# Patient Record
Sex: Female | Born: 1950 | Race: White | Hispanic: No | Marital: Single | Smoking: Former smoker
Health system: Southern US, Community
[De-identification: ages and names within clinical notes are randomized; demographics above are authoritative.]

## PROBLEM LIST (undated history)

## (undated) DIAGNOSIS — I639 Cerebral infarction, unspecified: Secondary | ICD-10-CM

## (undated) DIAGNOSIS — E785 Hyperlipidemia, unspecified: Secondary | ICD-10-CM

## (undated) DIAGNOSIS — I2510 Atherosclerotic heart disease of native coronary artery without angina pectoris: Secondary | ICD-10-CM

## (undated) DIAGNOSIS — R0902 Hypoxemia: Secondary | ICD-10-CM

## (undated) DIAGNOSIS — J449 Chronic obstructive pulmonary disease, unspecified: Secondary | ICD-10-CM

## (undated) DIAGNOSIS — Q211 Atrial septal defect, unspecified: Secondary | ICD-10-CM

## (undated) DIAGNOSIS — Z720 Tobacco use: Secondary | ICD-10-CM

## (undated) DIAGNOSIS — I517 Cardiomegaly: Secondary | ICD-10-CM

## (undated) HISTORY — PX: OTHER SURGICAL HISTORY: SHX169

## (undated) HISTORY — DX: Cardiomegaly: I51.7

## (undated) HISTORY — DX: Chronic obstructive pulmonary disease, unspecified: J44.9

## (undated) HISTORY — DX: Atrial septal defect: Q21.1

## (undated) HISTORY — DX: Hypoxemia: R09.02

## (undated) HISTORY — DX: Hyperlipidemia, unspecified: E78.5

## (undated) HISTORY — DX: Tobacco use: Z72.0

## (undated) HISTORY — DX: Atrial septal defect, unspecified: Q21.10

## (undated) HISTORY — DX: Cerebral infarction, unspecified: I63.9

## (undated) HISTORY — DX: Atherosclerotic heart disease of native coronary artery without angina pectoris: I25.10

---

## 2000-01-31 ENCOUNTER — Inpatient Hospital Stay (HOSPITAL_COMMUNITY): Admission: EM | Admit: 2000-01-31 | Discharge: 2000-02-08 | Payer: Self-pay

## 2000-02-01 ENCOUNTER — Encounter: Payer: Self-pay | Admitting: *Deleted

## 2000-02-06 ENCOUNTER — Encounter: Payer: Self-pay | Admitting: Neurology

## 2000-02-07 ENCOUNTER — Encounter: Payer: Self-pay | Admitting: Neurology

## 2000-06-04 ENCOUNTER — Ambulatory Visit (HOSPITAL_COMMUNITY): Admission: RE | Admit: 2000-06-04 | Discharge: 2000-06-04 | Payer: Self-pay | Admitting: *Deleted

## 2000-06-04 ENCOUNTER — Encounter: Payer: Self-pay | Admitting: *Deleted

## 2000-06-18 ENCOUNTER — Ambulatory Visit (HOSPITAL_COMMUNITY): Admission: RE | Admit: 2000-06-18 | Discharge: 2000-06-18 | Payer: Self-pay | Admitting: Interventional Radiology

## 2000-06-20 ENCOUNTER — Encounter: Payer: Self-pay | Admitting: Pulmonary Disease

## 2000-06-25 ENCOUNTER — Encounter: Payer: Self-pay | Admitting: *Deleted

## 2000-06-25 ENCOUNTER — Ambulatory Visit (HOSPITAL_COMMUNITY): Admission: RE | Admit: 2000-06-25 | Discharge: 2000-06-25 | Payer: Self-pay | Admitting: *Deleted

## 2000-06-27 ENCOUNTER — Ambulatory Visit (HOSPITAL_COMMUNITY): Admission: RE | Admit: 2000-06-27 | Discharge: 2000-06-28 | Payer: Self-pay | Admitting: Interventional Radiology

## 2000-06-28 ENCOUNTER — Encounter: Payer: Self-pay | Admitting: Pulmonary Disease

## 2000-08-20 ENCOUNTER — Ambulatory Visit (HOSPITAL_COMMUNITY): Admission: RE | Admit: 2000-08-20 | Discharge: 2000-08-20 | Payer: Self-pay | Admitting: *Deleted

## 2000-08-20 ENCOUNTER — Encounter: Payer: Self-pay | Admitting: *Deleted

## 2000-11-05 ENCOUNTER — Other Ambulatory Visit: Admission: RE | Admit: 2000-11-05 | Discharge: 2000-11-05 | Payer: Self-pay | Admitting: *Deleted

## 2001-11-19 ENCOUNTER — Ambulatory Visit (HOSPITAL_COMMUNITY): Admission: RE | Admit: 2001-11-19 | Discharge: 2001-11-19 | Payer: Self-pay | Admitting: Interventional Radiology

## 2002-01-21 ENCOUNTER — Other Ambulatory Visit: Admission: RE | Admit: 2002-01-21 | Discharge: 2002-01-21 | Payer: Self-pay | Admitting: Obstetrics and Gynecology

## 2002-03-02 ENCOUNTER — Encounter: Payer: Self-pay | Admitting: *Deleted

## 2002-03-02 ENCOUNTER — Ambulatory Visit (HOSPITAL_COMMUNITY): Admission: RE | Admit: 2002-03-02 | Discharge: 2002-03-02 | Payer: Self-pay | Admitting: *Deleted

## 2003-11-12 ENCOUNTER — Ambulatory Visit (HOSPITAL_COMMUNITY): Admission: RE | Admit: 2003-11-12 | Discharge: 2003-11-12 | Payer: Self-pay | Admitting: *Deleted

## 2003-11-12 ENCOUNTER — Encounter: Payer: Self-pay | Admitting: Pulmonary Disease

## 2004-01-04 ENCOUNTER — Other Ambulatory Visit: Admission: RE | Admit: 2004-01-04 | Discharge: 2004-01-04 | Payer: Self-pay | Admitting: Obstetrics and Gynecology

## 2004-10-24 ENCOUNTER — Encounter: Admission: RE | Admit: 2004-10-24 | Discharge: 2004-10-24 | Payer: Self-pay | Admitting: Unknown Physician Specialty

## 2005-01-24 ENCOUNTER — Other Ambulatory Visit: Admission: RE | Admit: 2005-01-24 | Discharge: 2005-01-24 | Payer: Self-pay | Admitting: Obstetrics and Gynecology

## 2005-08-18 ENCOUNTER — Encounter: Admission: RE | Admit: 2005-08-18 | Discharge: 2005-08-18 | Payer: Self-pay | Admitting: Unknown Physician Specialty

## 2006-08-07 ENCOUNTER — Encounter: Admission: RE | Admit: 2006-08-07 | Discharge: 2006-08-07 | Payer: Self-pay | Admitting: *Deleted

## 2006-09-30 ENCOUNTER — Encounter: Admission: RE | Admit: 2006-09-30 | Discharge: 2006-09-30 | Payer: Self-pay | Admitting: Family Medicine

## 2007-11-19 ENCOUNTER — Encounter: Payer: Self-pay | Admitting: Pulmonary Disease

## 2007-11-26 ENCOUNTER — Encounter: Payer: Self-pay | Admitting: Pulmonary Disease

## 2007-11-27 ENCOUNTER — Ambulatory Visit: Payer: Self-pay | Admitting: Pulmonary Disease

## 2007-11-27 DIAGNOSIS — J4489 Other specified chronic obstructive pulmonary disease: Secondary | ICD-10-CM | POA: Insufficient documentation

## 2007-11-27 DIAGNOSIS — J449 Chronic obstructive pulmonary disease, unspecified: Secondary | ICD-10-CM | POA: Insufficient documentation

## 2007-11-27 DIAGNOSIS — E785 Hyperlipidemia, unspecified: Secondary | ICD-10-CM | POA: Insufficient documentation

## 2007-11-27 DIAGNOSIS — R69 Illness, unspecified: Secondary | ICD-10-CM | POA: Insufficient documentation

## 2007-11-27 DIAGNOSIS — I6350 Cerebral infarction due to unspecified occlusion or stenosis of unspecified cerebral artery: Secondary | ICD-10-CM | POA: Insufficient documentation

## 2007-11-27 DIAGNOSIS — F17200 Nicotine dependence, unspecified, uncomplicated: Secondary | ICD-10-CM | POA: Insufficient documentation

## 2007-11-28 ENCOUNTER — Telehealth (INDEPENDENT_AMBULATORY_CARE_PROVIDER_SITE_OTHER): Payer: Self-pay | Admitting: *Deleted

## 2007-12-04 ENCOUNTER — Encounter: Payer: Self-pay | Admitting: Pulmonary Disease

## 2007-12-11 ENCOUNTER — Encounter: Payer: Self-pay | Admitting: Pulmonary Disease

## 2007-12-11 HISTORY — PX: CARDIAC CATHETERIZATION: SHX172

## 2007-12-29 ENCOUNTER — Ambulatory Visit: Payer: Self-pay | Admitting: Pulmonary Disease

## 2008-01-08 ENCOUNTER — Ambulatory Visit: Payer: Self-pay | Admitting: Pulmonary Disease

## 2008-02-05 ENCOUNTER — Telehealth: Payer: Self-pay | Admitting: Pulmonary Disease

## 2008-02-24 ENCOUNTER — Ambulatory Visit: Payer: Self-pay | Admitting: Pulmonary Disease

## 2008-05-05 ENCOUNTER — Inpatient Hospital Stay (HOSPITAL_COMMUNITY): Admission: RE | Admit: 2008-05-05 | Discharge: 2008-05-06 | Payer: Self-pay | Admitting: Cardiology

## 2008-05-05 ENCOUNTER — Encounter (INDEPENDENT_AMBULATORY_CARE_PROVIDER_SITE_OTHER): Payer: Self-pay | Admitting: Cardiology

## 2008-05-05 HISTORY — PX: ASD REPAIR: SHX258

## 2008-05-21 ENCOUNTER — Encounter: Payer: Self-pay | Admitting: Internal Medicine

## 2008-11-11 ENCOUNTER — Encounter: Payer: Self-pay | Admitting: Pulmonary Disease

## 2009-05-12 ENCOUNTER — Encounter: Payer: Self-pay | Admitting: Pulmonary Disease

## 2009-05-19 ENCOUNTER — Encounter: Payer: Self-pay | Admitting: Pulmonary Disease

## 2009-06-14 ENCOUNTER — Ambulatory Visit: Payer: Self-pay | Admitting: Psychology

## 2009-07-15 ENCOUNTER — Encounter: Payer: Self-pay | Admitting: Pulmonary Disease

## 2009-08-03 ENCOUNTER — Ambulatory Visit: Payer: Self-pay | Admitting: Psychology

## 2009-08-26 ENCOUNTER — Ambulatory Visit: Payer: Self-pay | Admitting: Pulmonary Disease

## 2009-10-24 ENCOUNTER — Ambulatory Visit: Payer: Self-pay | Admitting: Pulmonary Disease

## 2009-11-14 ENCOUNTER — Encounter: Admission: RE | Admit: 2009-11-14 | Discharge: 2009-11-14 | Payer: Self-pay | Admitting: Family Medicine

## 2009-11-16 ENCOUNTER — Ambulatory Visit: Payer: Self-pay | Admitting: Pulmonary Disease

## 2009-12-14 ENCOUNTER — Encounter: Admission: RE | Admit: 2009-12-14 | Discharge: 2009-12-14 | Payer: Self-pay | Admitting: Family Medicine

## 2010-01-30 ENCOUNTER — Encounter: Admission: RE | Admit: 2010-01-30 | Discharge: 2010-01-30 | Payer: Self-pay | Admitting: Emergency Medicine

## 2010-05-30 NOTE — Assessment & Plan Note (Signed)
Summary: sob,prod cough, no improvement after 2 rounds pred./apc   Visit Type:  Follow-up Copy to:  Dr. Jacinto Halim Primary Provider/Referring Provider:  Dr.Keever at [facility]  CC:  Pt c/o wheezing, SOB with exertion and at rest, and and productive cough with "yellowish" mucus since July 1st.  History of Present Illness: 58/F,  heavy smoker, about 79 Pyrs with sleep related hypoxemia she has undergone extensive neurological evaluation for memory deficits  incl PSG 3/11 - showed desaturations for 29 mins out of 7 hrs of sleep, bad performance review at work. gained 15 lbs, back to smoking  >> joined a gym, wt watchers, back on chantix got off prozac & keppra Feel sleep related hypoxemia is a combination of COPD & PFO , although shunt was not evident on prior studies. Trial of  nocturnal O2  helped  November 16, 2009 3:08 PM  EPisode of 'bronchitis ' 3 wks ago - coughing, wheezing - given biaxin & prednisone dosepak - caused mood changes & irritability, CXR clear. Now dyspnea improved, clear phlegm but feels 'lousy' Labs - WC 12k, high LDL & HDL, low vit D levels. she has stopped spiriva >> resumed  Preventive Screening-Counseling & Management  Alcohol-Tobacco     Smoking Status: current     Smoking Cessation Counseling: yes     Packs/Day: 0.75     Year Started: 30 years ago - (1979)  Current Medications (verified): 1)  Boniva 150 Mg Tabs (Ibandronate Sodium) .... Take 1 Tablet By Mouth Once A Week 2)  Aspirin 81 Mg Tabs (Aspirin) .Marland Kitchen.. 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 Cardiac evaluation in the past for MCA CVA has revealed Atrial septal aneurysm with PFO but no significant Rt to Lt shunt, 1-2+ MR. Spirometry >>FEV1 55%, no BD response Desaturates on exertion - this is somewhat out of proportion to degree of airway obstruction. 8/09>>  cath data RA 14/11, RV 35/10, PA 35/13, PCWP 12, LA 15/18, CI 3.45. No L--> R shunt , able to cross PFO from RA to LA echo >> nml LV fn, no ASD on bubble study 10/27 >> PFTs FEV1 61%, DLCO 71%, decreased lung volumes CXR >> no evidence of ILD  Social History: Packs/Day:  0.75  Review of Systems       The patient complains of dyspnea on exertion.  The patient denies anorexia, fever, weight loss, weight gain, vision loss, decreased hearing, hoarseness, chest pain, syncope, peripheral edema, prolonged cough, headaches, hemoptysis, abdominal pain, melena, hematochezia, severe indigestion/heartburn, hematuria, muscle weakness, suspicious skin lesions, difficulty walking, depression, unusual weight change, and abnormal bleeding.    Vital Signs:  Patient profile:   60 year old female Height:      63 inches Weight:      186 pounds BMI:     33.07 O2 Sat:      96 % on Room air Temp:     98.8 degrees F oral Pulse rate:   104 / minute BP sitting:   154 / 82  (right arm) Cuff size:   regular  Vitals Entered By: Zackery Barefoot CMA (November 16, 2009 2:42 PM)  O2 Flow:  Room air CC: Pt c/o wheezing, SOB with exertion and at rest, and productive cough with "yellowish" mucus since July 1st Comments Medications reviewed with patient Verified contact number and pharmacy with patient Zackery Barefoot CMA  November 16, 2009 2:42 PM    Physical Exam  Additional Exam:  Gen. Pleasant, well-nourished, in no distress ENT - no lesions, no post nasal drip Neck: No JVD, no thyromegaly, no carotid bruits Lungs: no use of accessory muscles, no dullness to percussion,  clear without rales or rhonchi  Cardiovascular: Rhythm regular, heart sounds  normal, no murmurs or gallops, no peripheral edema Musculoskeletal: No deformities, no cyanosis or clubbing      Impression & Recommendations:  Problem # 1:  C O P D (ICD-496) Exacerbation seems to have resolved but some symptoms persist. resume spiriva   Problem # 2:  TOBACCO ABUSE (ICD-305.1)  Remains the most imprortant issue - not willing to commit to quit date yet.  Orders: Est. Patient Level III (91638)  Problem # 3:  HYPOXEMIA HYPOXIA HYPOXIC (ICD-799)  ct nocturnal O2  Orders: Est. Patient Level III (46659)  Medications Added to Medication List This Visit: 1)  Boniva 150 Mg Tabs (Ibandronate sodium) .... Take 1 tablet by mouth once a week  Patient Instructions: 1)  Copy sent to: Dr Artis Flock 2)  Please schedule a follow-up appointment in 6 months. 3)  Sample of spiriva once daily

## 2010-05-30 NOTE — Assessment & Plan Note (Signed)
Summary: 6 weeks/ mbw   Visit Type:  Follow-up Copy to:  Dr. Jacinto Halim Primary Provider/Referring Provider:  Dr.Keever at [facility]  CC:  Hypoxemia. The patient says she is doing better since starting oxygen therapy at night. She is less tired during the day and feels like her energy level has increased. Patient is still smoking and not taking Chantix on a regular basis. Amanda Dean  History of Present Illness: 60/F,  heavy smoker, about 61 Pyrs with sleep related hypoxemia   August 26, 2009 9:25 AM  memory deficits --> underwent a battery of tests by Dr Sandria Manly including sleep study - showed desaturations for 29 mins out of 7 hrs of sleep, bad performance review at work. gained 15 lbs, back to smoking  >> joined a gym, wt watchers, back on chantix got off prozac & keppra, she has stopped spiriva >> resumed Feel sleep related hypoxemia is a combination of COPD & PFO , although shunt was not evident on prior studies. Will try nocturnal O2  & reassess in 6 wks if memory deficits are better. she has undergone extensive neurological evaluation incl PSG 3/11  October 24, 2009 9:19 AM  more energy with O2, memory still bad - job suffering. doing better since starting oxygen therapy at night. She is less tired during the day and feels like her energy level has increased. Patient is still smoking and not taking Chantix on a regular basis. .  Preventive Screening-Counseling & Management  Alcohol-Tobacco     Smoking Status: current     Smoking Cessation Counseling: yes     Packs/Day: 1.0  Current Medications (verified): 1)  Boniva 3 Mg/12ml  Kit (Ibandronate Sodium) .... Every Month 2)  Crestor 10 Mg  Tabs (Rosuvastatin Calcium) .... Once Daily 3)  Chantix Continuing Month Pak 1 Mg  Tabs (Varenicline Tartrate) .... As Directed 4)  Aspirin 81 Mg Tabs (Aspirin) .Amanda Dean.. 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 Cardiac evaluation in the past for MCA CVA has revealed Atrial septal aneurysm with PFO but no significant Rt to Lt shunt, 1-2+ MR. Spirometry >>FEV1 55%, no BD response Desaturates on exertion - this is somewhat out of proportion to degree of airway obstruction. 8/09>>  cath data RA 14/11, RV 35/10, PA 35/13, PCWP 12, LA 15/18, CI 3.45. No L--> R shunt , able to cross PFO from RA to LA echo >> nml LV fn, no ASD on bubble study 10/27 >> PFTs FEV1 61%, DLCO 71%, decreased lung volumes CXR >> no evidence of ILD  Social History: Packs/Day:  1.0  Review of Systems       The patient complains of dyspnea on exertion.  The patient denies anorexia, fever, weight loss, weight gain, vision loss, decreased hearing, hoarseness, chest pain, syncope, peripheral edema, prolonged cough, headaches, hemoptysis, abdominal pain, melena, hematochezia, severe indigestion/heartburn, hematuria, muscle weakness, suspicious skin lesions, transient blindness, difficulty walking, depression, unusual weight change, and abnormal bleeding.    Vital Signs:  Patient profile:   60 year old female Height:  63 inches (160.02 cm) Weight:      183 pounds (83.18 kg) BMI:     32.53 O2 Sat:      95 % on Room air Temp:     98.4 degrees F (36.89 degrees C) oral Pulse rate:   81 / minute BP sitting:   108 / 68  (left arm) Cuff size:   regular  Vitals Entered By: Michel Bickers CMA (October 24, 2009 9:12 AM)  O2 Sat at Rest %:  95 O2 Flow:  Room air CC: Hypoxemia. The patient says she is doing better since starting oxygen therapy at  night. She is less tired during the day and feels like her energy level has increased. Patient is still smoking and not taking Chantix on a regular basis.  Comments Medications reviewed. Daytime phone verified. Michel Bickers CMA  October 24, 2009 9:13 AM   Physical Exam  Additional Exam:  Gen. Pleasant, well-nourished, in no distress ENT - no lesions, no post nasal drip Neck: No JVD, no thyromegaly, no carotid bruits Lungs: no use of accessory muscles, no dullness to percussion, clear without rales or rhonchi  Cardiovascular: Rhythm regular, heart sounds  normal, no murmurs or gallops, no peripheral edema Musculoskeletal: No deformities, no cyanosis or clubbing      Impression & Recommendations:  Problem # 1:  C O P D (ICD-496) Have not insisted on her taking spiriva - sinc eher main complaint has been memory issues rather than dyspnea no desaturation on exertion - so can do without portable O2  Problem # 2:  HYPOXEMIA HYPOXIA HYPOXIC (ICD-799)  ct nocturnal O2  Orders: Est. Patient Level III (53664)  Problem # 3:  TOBACCO ABUSE (ICD-305.1)  Her updated medication list for this problem includes:    Chantix Continuing Month Pak 1 Mg Tabs (Varenicline tartrate) .Amanda Dean... As directed  -clearly need to focus our efforts here. chantix x 6 months  Orders: Est. Patient Level III (40347)  Medications Added to Medication List This Visit: 1)  Chantix Continuing Month Pak 1 Mg Tabs (Varenicline tartrate) .... As directed 2)  Aspirin 81 Mg Tabs (Aspirin) .Amanda Dean.. 1 by mouth two times a day  Patient Instructions: 1)  Please schedule a follow-up appointment in 6 months. 2)  Patient advised to stop smoking.  Recommended to schedule an appointment with one of the advanced practice nurses for smoking cessation counseling. 3)  Continue to use oxygen every night.

## 2010-05-30 NOTE — Assessment & Plan Note (Signed)
Summary: OV to discuss nocturnal oxygen, per RA-jwr   Visit Type:  Follow-up Copy to:  Dr. Jacinto Halim Primary Provider/Referring Provider:  Dr.Keever at [facility]  CC:  Pt here to discuss ONO results.  History of Present Illness: 58/F,  heavy smoker, about 70 Pyrs with sleep related hypoxemia 7/09 initial OV -Referred by Dr Jacinto Halim for evaluation of hypoxia on exertion & lying down. Cardiac evaluation in the past for MCA CVA has revealed Atrial septal aneurysm with PFO but no significant Rt to Lt shunt, 1-2+ MR. Reports dyspnea on walking stairs & level ground over longer distances. Has gained 20-30 lbs over last few years. Spirometry >>FEV1 55%, no BD response Labs do not show polycythemia, slight high bicarb. Desaturates on exertion - this is somewhat out of proportion to degree of airway obstruction. 8/09>>  cath data RA 14/11, RV 35/10, PA 35/13, PCWP 12, LA 15/18, CI 3.45. No L--> R shunt , able to cross PFO from RA to LA echo >> nml LV fn, no ASD on bubble study  8/31>> chantix helped her quit x 3 days, now back to less than a PPD. 10/27 >> PFTs FEV1 61%, DLCO 71%, decreased lung volumes CXR >> no evidence of ILD  August 26, 2009 9:25 AM  memory deficits --> underwent a battery of tests by Dr Sandria Manly including sleep study - showed desaturations for 29 mins out of 7 hrs of sleep, bad performance review at work. gained 15 lbs, back to smoking  >> joined a gym, wt watchers, back on chantix got off prozac & keppra, she has stopped spiriva   Current Medications (verified): 1)  Boniva 3 Mg/7ml  Kit (Ibandronate Sodium) .... Every Month 2)  Crestor 10 Mg  Tabs (Rosuvastatin Calcium) .... Once Daily 3)  Chantix Continuing Month Pak 1 Mg  Tabs (Varenicline Tartrate) .... 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  The patient denies anorexia, fever, weight loss, weight gain, vision loss, decreased hearing, hoarseness, chest pain, syncope, peripheral edema, prolonged cough, headaches, hemoptysis, abdominal pain, melena, hematochezia, severe indigestion/heartburn, hematuria, muscle weakness, suspicious skin lesions, difficulty walking, depression, unusual weight change, and abnormal bleeding.    Vital Signs:  Patient profile:   60 year old female Height:      63 inches Weight:      185.50 pounds BMI:     32.98 O2 Sat:      96 % on Room air Temp:     98.3 degrees F oral Pulse rate:   83 / minute BP sitting:   136 / 80  (right arm) Cuff size:   regular  Vitals Entered By: Zackery Barefoot CMA (August 26, 2009 9:15 AM)  O2 Flow:  Room air  Serial Vital Signs/Assessments:  Comments: 9:48 AM Ambulatory Pulse Oximetry  Resting; HR_69___    02 Sat__95% on room air___  Lap1 (185 feet)   HR__103___   02 Sat_93% on room air____ Lap2 (185 feet)   HR__110___   02 Sat__92% on room air___    Lap3 (185 feet)   HR__121__  02 Sat__92% on room air___  x__Test Completed without Difficulty ___Test Stopped due to:  By: Michel Bickers CMA   CC: Pt here to discuss ONO results Comments Medications reviewed with patient Verified contact number and pharmacy with patient Zackery Barefoot CMA  August 26, 2009 9:16 AM    Physical Exam  Additional Exam:  Gen. Pleasant, well-nourished, in no distress ENT - no lesions, no post nasal drip Neck: No JVD, no thyromegaly, no carotid bruits Lungs: no use of accessory muscles, no dullness to percussion, clear without rales or rhonchi  Cardiovascular:  Rhythm regular, heart sounds  normal, no murmurs or gallops, no peripheral edema Musculoskeletal: No deformities, no cyanosis or clubbing      Impression & Recommendations:  Problem # 1:  HYPOXEMIA HYPOXIA HYPOXIC (ICD-799)  Feel sleep related hypoxemia is a combination of COPD & PFO , although shunt was not evident on prior studies. Will try nocturnal O2  & reassess in 6 wks if memory deficits are better. she has undergone extensive neurological evaluation otherwise.  Orders: Est. Patient Level IV (16109)  Problem # 2:  TOBACCO ABUSE (ICD-305.1) -clearly need to focus our efforts here. chantix x 6 months Her updated medication list for this problem includes:    Chantix Continuing Month Pak 1 Mg Tabs (Varenicline tartrate) .Marland Kitchen... As directed  Orders: DME Referral (DME) Est. Patient Level IV (60454)  Problem # 3:  C O P D (ICD-496) resume spiriva no desaturation on exertion - so can do without portable O2  Patient Instructions: 1)  Copy sent to: Dr Sandria Manly, Dr Cleda Clarks at [facility] 2)  Please schedule a follow-up appointment in 6 weeks 3)  We wills et you up with oxygen   Immunization History:  Influenza Immunization History:    Influenza:  historical (01/31/2009)

## 2010-05-30 NOTE — Letter (Signed)
Summary: [facility]  [facility]   Imported By: Sherian Rein 08/25/2009 15:10:00  _____________________________________________________________________  External Attachment:    Type:   Image     Comment:   External Document

## 2010-08-14 LAB — CBC
HCT: 37.5 % (ref 36.0–46.0)
HCT: 40.3 % (ref 36.0–46.0)
MCHC: 34.3 g/dL (ref 30.0–36.0)
MCV: 98.1 fL (ref 78.0–100.0)
Platelets: 186 10*3/uL (ref 150–400)
RBC: 3.86 MIL/uL — ABNORMAL LOW (ref 3.87–5.11)
RBC: 4.11 MIL/uL (ref 3.87–5.11)
RDW: 13 % (ref 11.5–15.5)
WBC: 8.2 10*3/uL (ref 4.0–10.5)
WBC: 8.9 10*3/uL (ref 4.0–10.5)

## 2010-08-14 LAB — FOLATE RBC: RBC Folate: 949 ng/mL — ABNORMAL HIGH (ref 180–600)

## 2010-09-12 NOTE — Discharge Summary (Signed)
NAMETIANNE, PLOTT NO.:  [id number]   MEDICAL RECORD NO.:  [id number]          PATIENT TYPE:  INP   LOCATION:  6526                         FACILITY:  [facility]   PHYSICIAN:  Cristy Hilts. Jacinto Halim, MD       DATE OF BIRTH:  06/17/1950   DATE OF ADMISSION:  05/05/2008  DATE OF DISCHARGE:  05/06/2008                               DISCHARGE SUMMARY   Ms. Kittle is a 60 year old female patient who has symptoms of dyspnea on  exertion out of proportion to her known COPD.  She was diagnosed with a  PFO.  It was decided that she should have this closed because of her  symptomatology.  She came in for elective PFO closure.  This was  performed by Dr. Jacinto Halim on May 05, 2008.  It was successful.  She had  a STARFlex septal occluder implant SF28 VSD.  No residual shunting was  noted.  The following day, Dr. Jacinto Halim saw Ms. Wind.  Her blood pressure  was 114/50, heart rate was 64, and respirations were 16.  She was set up  to have an outpatient 2-D echocardiogram on the day of discharge which  is May 06, 2008, at 1:30 to view her closure device and make sure she  did not have a pericardial effusion.  She will follow up with Dr. Jacinto Halim  in 3-4 weeks.  She can return to work on Monday.  Her groin was without  hematoma.   DISCHARGE MEDICATIONS:  1. Plavix 75 mg a day.  2. Keppra 500 mg twice a day.  3. Boniva monthly.  4. Prozac 20 mg twice a day.  5. Crestor 10 mg daily.  6. Xanax 0.4 as needed for anxiety.  7. Spiriva 18 mcg inhaled per day.  8. Aspirin 325 mg a day.   She was told she will need to take Plavix for 3 months.  She should take  her aspirin for the next 6 months if she has to have any procedures of  gastrointestinal, GU, or dental.  She should call our office because she  will need to take an antibiotic before.  She will again follow up with  Dr. Jacinto Halim in 3-4 weeks.   DISCHARGE DIAGNOSES:  1. Status post patent foramen ovale closure.  2. Symptoms of dyspnea out  of proportion to chronic obstructive      pulmonary disease.  3. Chronic obstructive pulmonary disease.  4. Cardiac catheterization in August 2009 with essentially normal      coronary arteries.  5. Normal left ventricular function.  6. History of cerebrovascular accident without residual.  7. Tobacco smoking.      Lezlie Octave, N.P.      Cristy Hilts. Jacinto Halim, MD  Electronically Signed    BB/MEDQ  D:  05/06/2008  T:  05/06/2008  Job:  [id number]

## 2010-09-12 NOTE — Cardiovascular Report (Signed)
NAMEYEN, WANDELL NO.:  [id number]   MEDICAL RECORD NO.:  [id number]          PATIENT TYPE:  INP   LOCATION:  6526                         FACILITY:  [facility]   PHYSICIAN:  Cristy Hilts. Jacinto Halim, MD       DATE OF BIRTH:  1950/06/24   DATE OF PROCEDURE:  DATE OF DISCHARGE:                            CARDIAC CATHETERIZATION   PROCEDURE PERFORMED:  Intracardiac echocardiography-guided closure of  the atrial septal defect.   INDICATION:  Ms. Marilena Trevathan is a 60 year old female who has had a  stroke in the past in 2000.  It was felt to be cardioembolic.  She has  been having increasing shortness of breath.  She has been extensively  evaluated by Dr. Cyril Mourning for shortness of breath, and her shortness  of breath was disproportionate to her COPD.  Because of her desaturation  on walking, her saturations were dropped down to high 80s, and she was  also having symptomatic shortness of breath.  She has a known large  atrial septal defect with strongly positive right-to-left shunting.  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  Hence, she is brought  to the catheterization lab for closure of the atrial septal defect.   INTRACARDIAC ECHO DATA:  The structures of the heart appeared within  normal limits.  There was trace mitral and trace tricuspid  regurgitation.   Interatrial septum was aneurysmal with a large patent foramen ovale.  There was strongly positive right-to-left shunting with a double  contrast injection.   INTERVENTION DATA:  Successful closure of the ASD with implantation of a  StarFlex septal occluder.  A 28-mm septal occluder was deployed with  complete closure of the ASD, i.e., PFO, with postprocedure double  contrast injection revealing no residual leak.  No residual shunting  across the interatrial septum.   RECOMMENDATIONS:  The patient will be discharged home in the morning.  She will need a transthoracic echocardiogram as an outpatient basis to  exclude pericardial effusion and device position.  She will need  endocarditis prophylaxis for a period of 6 months and Plavix for a  period of 3 months and aspirin indefinitely.  Again, smoking cessation  is indicated.   TECHNIQUE OF THE PROCEDURE:  Under usual sterile precautions, using 8-  [place] and a 9-French right femoral venous access, an intracardiac echo  probe was advanced through the 9-French venous sheath into the right  atrial position and the cardiac structures were carefully analyzed.  Using heparin for anticoagulation, we proceeded with intervention and  closure of the atrial septal defect.   Using a multipurpose A2 catheter, a 290-cm J-tipped Amplatzer wire was  utilized, and the PFO was easily crossed without even the help of a  catheter and the wire was carefully positioned in the left lower  pulmonary vein.  Having performed this, multipurpose A2 catheter was  pulled out and a 11-French Mullen sheath was advanced over this wire  into the left atrial position.  After loading the StarFlex device in  usual fashion, the device was advanced through the [facility] sheath into  the left atrium.  Under ICE guidance and fluoroscopic guidance, I was  able to deploy the left atrial side of the device and then carefully  abutting against the intra-atrial septum, right atrial side of the disk  was carefully deployed.  Having performed this, the position of the  device and presence of interatrial septum between the 2 disks was  carefully confirmed with intracardiac echo guidance.  The device was  released and prior to releasing, double contrast injection revealed  residual shunting; however, after the device was released, there was no  residual shunting across the intra-atrial septum, even with Valsalva  strain.  An 11-French Mullen sheath was pulled out of the body and an 11-  [place] short sheath was placed and sutured in place.  The patient  tolerated the procedure.  No immediate  complications noted.      Cristy Hilts. Jacinto Halim, MD  Electronically Signed     JRG/MEDQ  D:  05/05/2008  T:  05/06/2008  Job:  [id number]   cc:   Oretha Milch, MD  Dr. Theadora Rama

## 2010-09-12 NOTE — H&P (Signed)
NAMEZAINAB, CRUMRINE NO.:  [id number]   MEDICAL RECORD NO.:  [id number]          PATIENT TYPE:  OIB   LOCATION:  2899                         FACILITY:  [facility]   PHYSICIAN:  Cristy Hilts. Jacinto Halim, MD       DATE OF BIRTH:  13-Jun-1950   DATE OF ADMISSION:  05/05/2008  DATE OF DISCHARGE:                              HISTORY & PHYSICAL   CHIEF COMPLAINT:  Shortness of breath.   HISTORY OF PRESENT ILLNESS:  Ms. Dean is a pleasant 60 year old female  followed by Dr. Jacinto Halim and Dr. Theadora Rama and Dr. Cyril Mourning.  She  has a history of chronic dyspnea.  She is a long-time smoker and we  suspect she has some underlying degree of COPD.  She also has a PFO.  She had a right and left heart cath August 2009, at the [facility],  please refer to this catheterization for complete details.  She  essentially had normal coronaries.  She does have a PFO.  After  discussion with Dr. Jacinto Halim and Dr. Vassie Loll, we decided to admit her for  elective PFO closure.  Since Dr. Jacinto Halim saw the patient March 15, 2008, there has been no change.  She denies any worsening shortness of  breath or any illnesses or hospitalizations.  She has relapsed as far as  her smoking goes.   CURRENT MEDICATIONS:  1. Plavix 75 mg a day.  2. Keppra 500 mg b.i.d.  3. Boniva.  4. Prozac 20 mg twice a day.  5. Crestor 10 mg a day.  6. Spiriva 18 mcg daily.   ALLERGIES:  SHE HAS NO KNOWN DRUG ALLERGIES.   SOCIAL HISTORY:  She is divorced.  She has three children.  She smokes  about a half pack a day and has for many years.   FAMILY HISTORY:  Unremarkable.   REVIEW OF SYSTEMS:  Essentially unremarkable except for noted above.  She denies any history of GI bleeding, melena, thyroid problems or  kidney trouble.   PHYSICAL EXAMINATION:  VITAL SIGNS:  Blood pressure 160/90, pulse 80,  respirations 12.  GENERAL:  She is an obese female in no acute distress.  HEENT:  Normocephalic, atraumatic.  Extraocular  movements intact.  Nonicteric.  Lids conjunctivae within normal limits.  NECK:  Without bruit or JVD.  CHEST:  Reveals diminished breath sounds.  No wheezing.  CARDIAC:  Reveals regular rate and rhythm.  Soft systolic murmur at the  left sternal border and aortic valve area.  Normal S1-S2.  ABDOMEN:  Obese, nontender.  Bowel sounds are present.  EXTREMITIES:  Without edema.  Distal pulses are intact.  NEUROLOGICAL:  Exam grossly intact.  She is awake, alert, oriented,  operative.  Moves all extremities without obvious deficit.  SKIN:  Warm  and dry.   LABORATORY DATA:  Labs from the office show white count 8.0, hemoglobin  13.8, hematocrit 43.4, platelets 237.  Sodium 142, potassium 4.6, BUN  25, creatinine 0.73.  UA was negative.  TSH 1.05.  EKG showed sinus  rhythm without acute changes.   IMPRESSION:  1. Dyspnea and hypoxemia, out of proportion to her known COPD.  2. PFO.  3. History of stroke in 2000 without residual.  4. Insignificant coronary disease at catheterization August 2009 after      an abnormal Myoview.  5. Long history of smoking suspected underlying COPD.   PLAN:  The patient is admitted for elective PFO closure.  Risks and  benefits of the procedure have been discussed with the patient, and she  is agreeable to proceed.      Abelino Derrick, P.A.      Cristy Hilts. Jacinto Halim, MD  Electronically Signed    LKK/MEDQ  D:  05/05/2008  T:  05/05/2008  Job:  [id number]

## 2010-09-15 NOTE — Cardiovascular Report (Signed)
[place]  Patient:    Amanda Dean, Amanda Dean                          MRN: [id number] Proc. Date: 06/25/00 Adm. Date:  [id number] Disc. Date: [id number] Attending:  Lenise Herald H                        Cardiac Catheterization  PROCEDURE:  Left femoral venous sheath placement.  CARDIOLOGIST:  Lenise Herald, M.D.  COMPLICATIONS:  None.  INDICATIONS:  [name] is a 60 year old white female, patient of Dr. Corliss Skains,  who was initially admitted several weeks ago for cerebral PTA. The patient developed heart block during induction of anesthesia.  She is now brought back one day prior to rescheduled cerebral PTA for transvenous temporary pacer placement.  DESCRIPTION OF PROCEDURE:  After obtaining informed consent, the patient was brought to the cardiac catheterization lab where the right groin was shaved and prepped and draped in the usual sterile fashion.  ECG monitoring was established.  Using the modified Seldinger technique, a 6-French venous sheath was inserted in the left femoral vein.  After the sheath was inserted, we were notified through radiology that her interventional procedure had been cancelled secondary to equipment failure.  This was discussed with Dr. Corliss Skains who confirmed this.  After cancelling of the patient, we have elected to discontinue the venous sheath.  Pressure will be held for four hours, and she will be discharged to home.  The patient will be readmitted for repeat venous sheath placement and placement of temporary transvenous pacer wire on June 26, 2000.  This was explained in detail to the patient.  CONCLUSIONS:  Placement of left femoral venous sheath without complications. DD:  06/25/00 TD:  06/26/00 Job: [id number]

## 2010-09-15 NOTE — Op Note (Signed)
. [facility]  Patient:    BINDU, DOCTER                          MRN: [id number] Proc. Date: 01/31/00 Adm. Date:  [id number] Disc. Date: [id number] Attending:  Oneal Grout CC:         [facility]   Operative Report  HISTORY OF PRESENT ILLNESS:  Amanda Dean is a 60 year old, right-handed, separated female admitted from the [facility] Emergency Room with a chief complaint of left brain CVA.  Prior to her admission, the patient experienced her first episode of right-hand numbness which was very brief.  About one week, prior to admission, she had another episode of numbness involving the right arm and leg.  It was to the point where she was dragging her right foot. This lasted about five minutes.  On the day of admission, while at work, the patient noted problems with her speech and had difficulty getting her words out.  Her blood pressure was noted to be about 200/90 at that time.  She was referred to the emergency room.  A CT scan of the brain in the ER showed a lacunar infarct involving the left frontal centrum semiovale.  PAST MEDICAL HISTORY:  Otherwise, quite unremarkable.  The patients only medication had been Prempro.  PHYSICAL EXAMINATION:  On admission:  VITAL SIGNS:  Blood pressure 152/86 going to 132/74.  Pulse 84.  Respirations 16, temperature afebrile.  GENERAL:  The patient was alert and oriented.  Her speech was normal.  NEURO:  Her cranial nerve examination was negative.  She had 5/5 strength throughout.  Deep tendon reflexes were normally active and plantar responses were downgoing bilaterally.  Sensory examination revealed slight decrease of the pinprick sensation involving the right leg and arm.  IMPRESSION:  The patient was admitted with a diagnosis of subacute left subcortical CVA and question of labile hypertension.  The plan was to start her on heparin and perform a _____ workup.  LABORATORY  FINDINGS:  Admission CBC and differential showed a white cell count of 8.2000, hemoglobin 11.8, and hematocrit 34.1.  Platelet count was 184,000. There were 47% neutrophils, 42% lymphocytes, 6% monocytes.  The PT and PTT were 13.2 and 70 seconds, respectively, on heparin.  The patient remained therapeutic on heparin while it was administered.  Basic metabolic panels x3 were all normal.  Her lipid profile showed a cholesterol of 168, HDL 50, and LDL 100.  Triglycerides were 92 mg/dL.  MRI of the brain on February 01, 2000, showed two separate abnormal areas of infarction on perfusion weighted images of the large area in the precentral cortex and deep white matter, likely occurring two weeks prior in the smaller area in the left postcentral cortex occurring more recently.  There was an incidental 2 x 3 cm maxillary sinus retention cyst.  MR angiography showed a high grade proximal left middle cerebral artery stenosis estimated to be 95%.  There was also question of a 3-mm aneurysm at the junction of the M1 and M2 segments of the left MCA. Carotid Doppler studies showed no evidence of significant ICA stenosis and vertebral artery flow was antegrade bilaterally.  A 2-D echocardiogram showed moderate mitral regurgitation and mild left ventricular hypertrophy.  No obvious intracardiac source of emboli was noted.  On February 06, 2000, the patient underwent cerebral arteriography.  This showed a severe focal stenosis of the left middle cerebral artery, proximally, in  the M1 segment.  There was mild to moderate stenosis of the right M1 segment, proximally.  COURSE IN HOSPITAL:  The patient was initially admitted to [facility], was then transferred to [facility] for cerebral arteriogram in anticipation of possible endovascular procedure.  The vital signs remained quite stable with normal to low blood pressures.  The patient did experience a little numbness in her right leg on February 01, 2000.  The results of her MRI and MRA were noted and the patient was felt to be a candidate for cerebral arteriography.  At that point, the patient was transferred to [facility].  After extensive discussions with Dr. Corliss Skains, the patient agreed to cerebral arteriography.  This was carried out without difficulty with the above-noted findings.  At the same time, she underwent left middle cerebral artery angioplasty under general anesthesia with significant improvement in the caliber of the middle cerebral artery and hemodynamic flow.  The patient was observed with short-term consultations following her procedure and was felt to have arrived at maximum hospital benefit on February 08, 2000. Neurologic examination was nonfocal at that time.  Her sheath was pulled and the patient was discharged.  FINAL DIAGNOSES: 1. 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 2. Left middle cerebral artery stenosis, status post angioplasty. 3. Question of labile hypertension.  DISCHARGE MEDICATIONS: 1. Plavix 75 mg p.o. q.d. 2. Enteric coated aspirin 325 mg p.o. q.d. 3. Prempro as directed.  DISPOSITION:  The patient is discharged to home with instructions to rest for the weekend, advance activity as tolerated.  She is to remain on a low salt diet.  She is to return to see Dr. Noreene Filbert in one month and call Dr. Corliss Skains with any concerns.  CONDITION ON DISCHARGE:  Stable.  PROGNOSIS:  Good. DD:  08/12/00 TD:  08/12/00 Job: [id number]

## 2010-09-15 NOTE — Cardiovascular Report (Signed)
Amanda Dean, Amanda Dean NO.:  [id number]   MEDICAL RECORD NO.:  [id number]                   PATIENT TYPE:  AMB   LOCATION:  DFTL                                 FACILITY:  [facility]   PHYSICIAN:  Darlin Priestly, M.D.             DATE OF BIRTH:  16-Apr-1951   DATE OF PROCEDURE:  11/12/2003  DATE OF DISCHARGE:                              CARDIAC CATHETERIZATION   PROCEDURES PERFORMED:  Transesophageal echocardiogram.   ATTENDING:  Darlin Priestly, M.D.   COMPLICATIONS:  None.   INDICATIONS:  Ms. Weitman is a 60 year old female patient of Dr. Johnella Moloney  with a history of CVA with left MCA stenosis.  She was initially scheduled  for a left MCA angioplasty; however, she became bradycardic during the  procedure which was ultimately canceled.  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  She has had several discussions with Dr. Kevan Ny about PFO closure and is now  referred back for evaluation of her ASD.   DESCRIPTION OF OPERATION:  After giving informed written consent, patient  brought to endoscopy suite in a fasting state.  She then received Demerol  and Versed and after adequate anesthesia underwent a successful,  uncomplicated transesophageal echocardiogram.   The left ventricle appeared to be normal in size and function with estimated  EF of 60%.   Obstruction on the aortic valve with no evidence of significant aortic  stenosis or regurgitation.   Mildly thickened mitral valve leaflets with mild to moderate mitral  regurgitation.   Obstruction on the tricuspid valve with mild tricuspid regurgitation.   There did appear to be a small atrial septal defect present with minimal  left to right and mild right to left shunting by color and contrast  echocardiogram.   No evidence of intracardiac mass or thrombus noted.   Normal descending thoracic aorta.   CONCLUSION:  Successful  transesophageal echocardiogram with findings noted  above.  The patient does have a small atrial septal defect with minimal left  to right and right to left shunting by color and contrast echocardiogram.                                               Darlin Priestly, M.D.    RHM/MEDQ  D:  11/12/2003  T:  11/12/2003  Job:  [id number]   cc:   Candyce Churn, M.D.  [address] [place]  [place] [address]  Fax: [phone]

## 2010-09-21 IMAGING — CR DG CERVICAL SPINE COMPLETE 4+V
6 series · 6 of 6 positions shown · non-contrast
Comparison: None.

CLINICAL DATA: Right sided numbness and tingling - known injury

CERVICAL SPINE - COMPLETE 4+ VIEW

[view not recorded (1 of 6)]
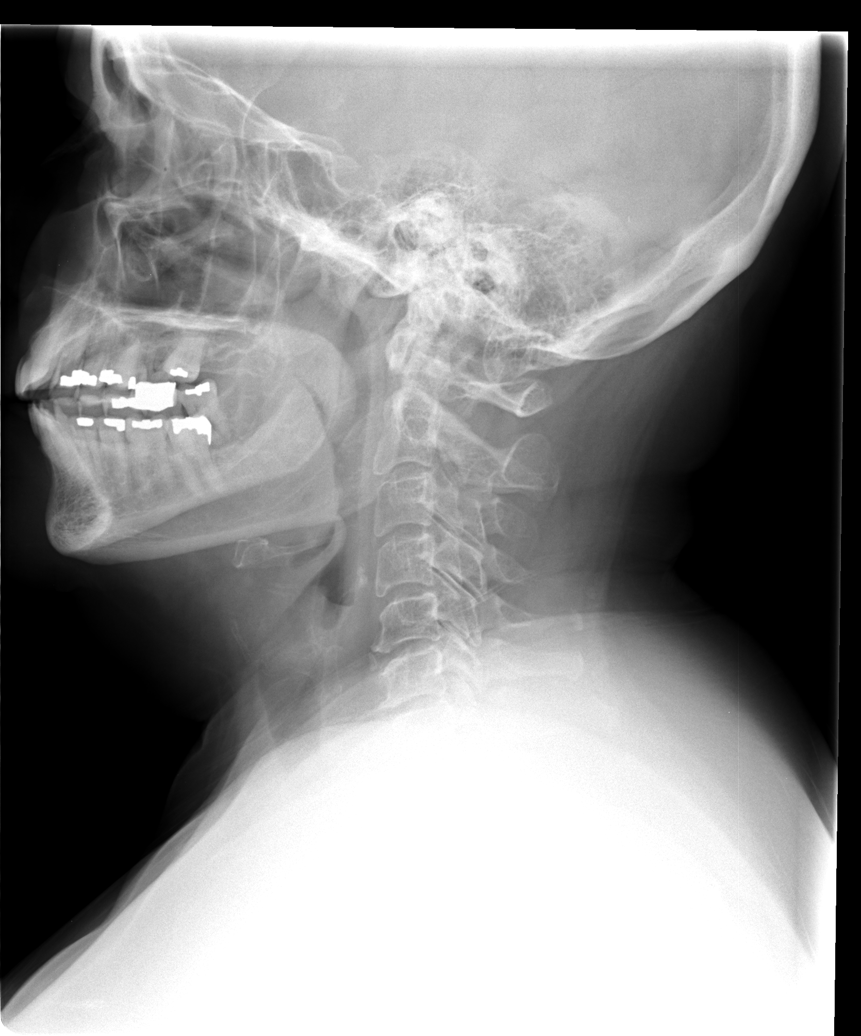

[view not recorded (2 of 6)]
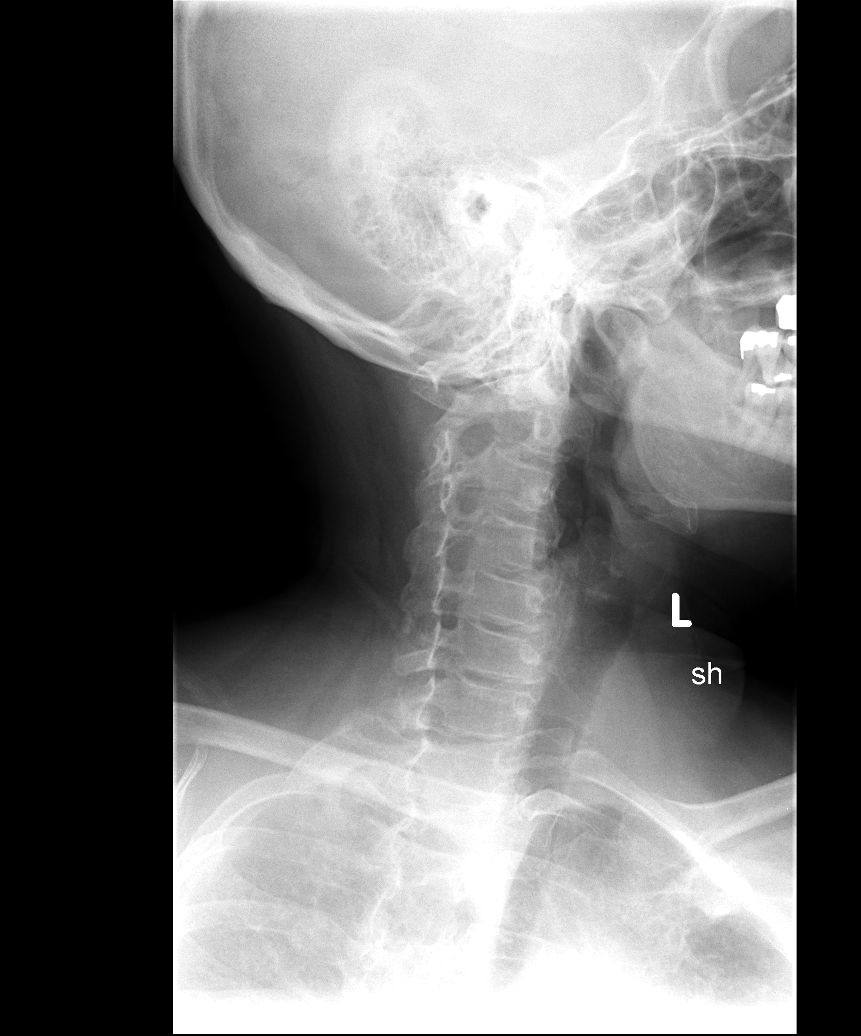

[view not recorded (3 of 6)]
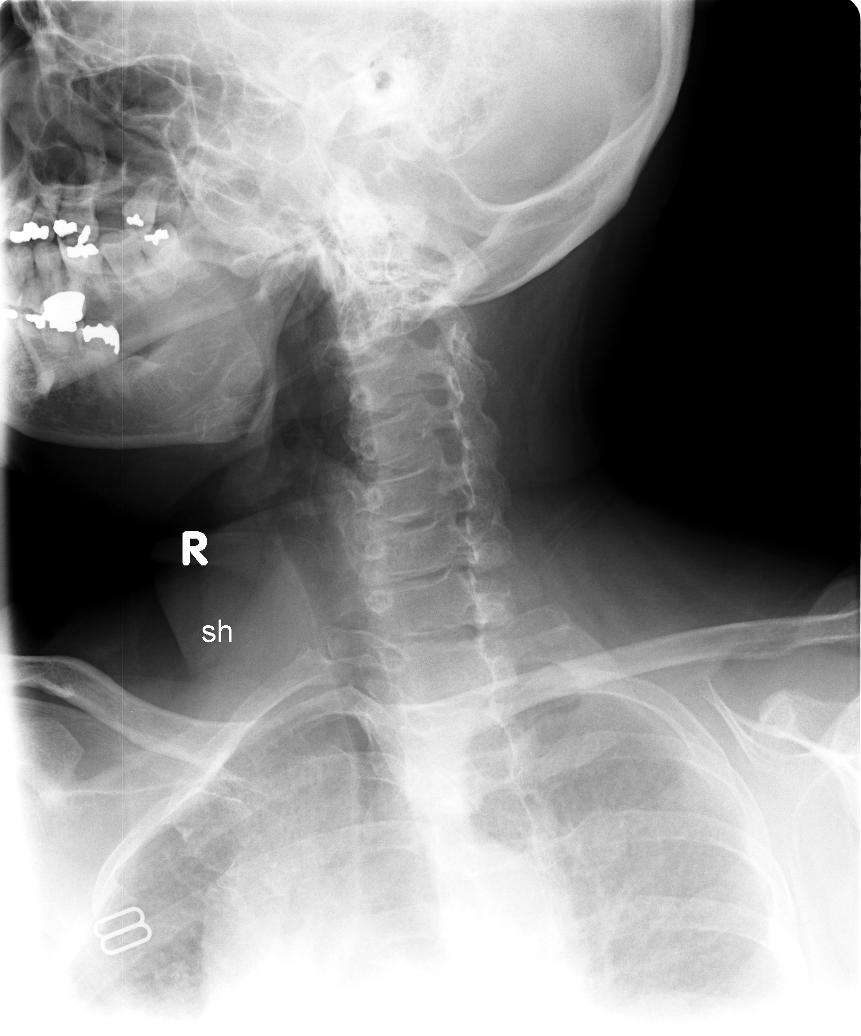

[view not recorded (4 of 6)]
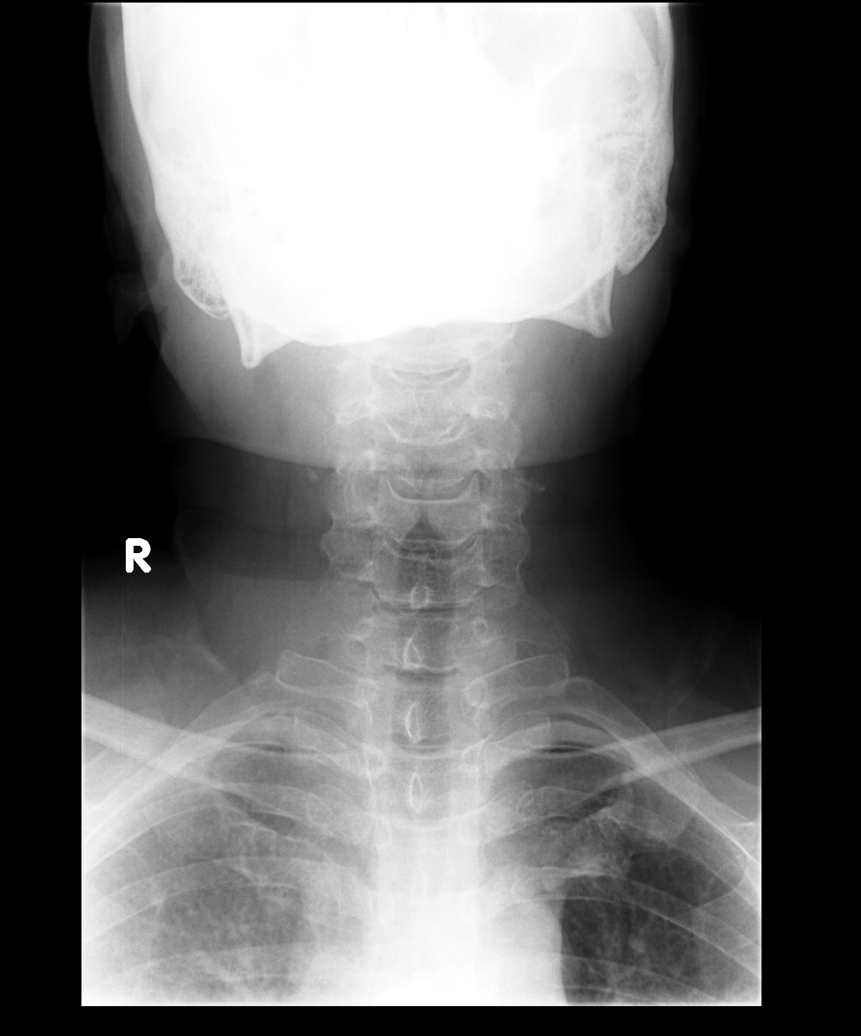

[view not recorded (5 of 6)]
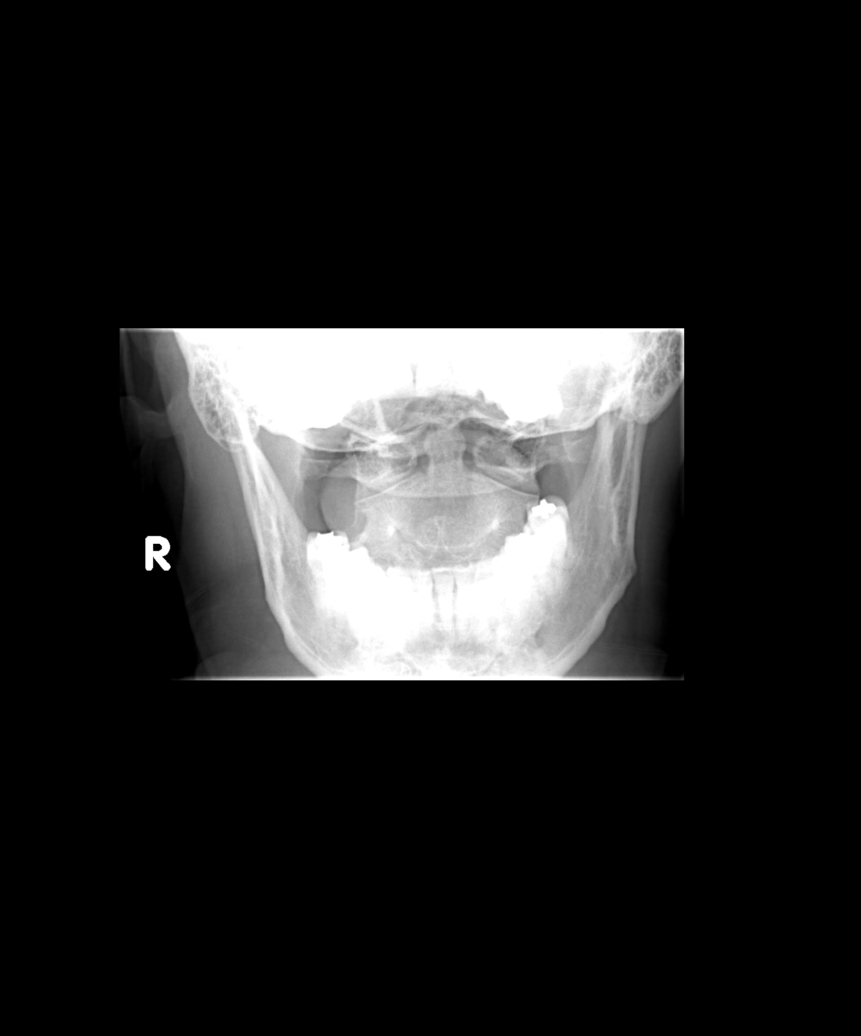

[view not recorded (6 of 6)]
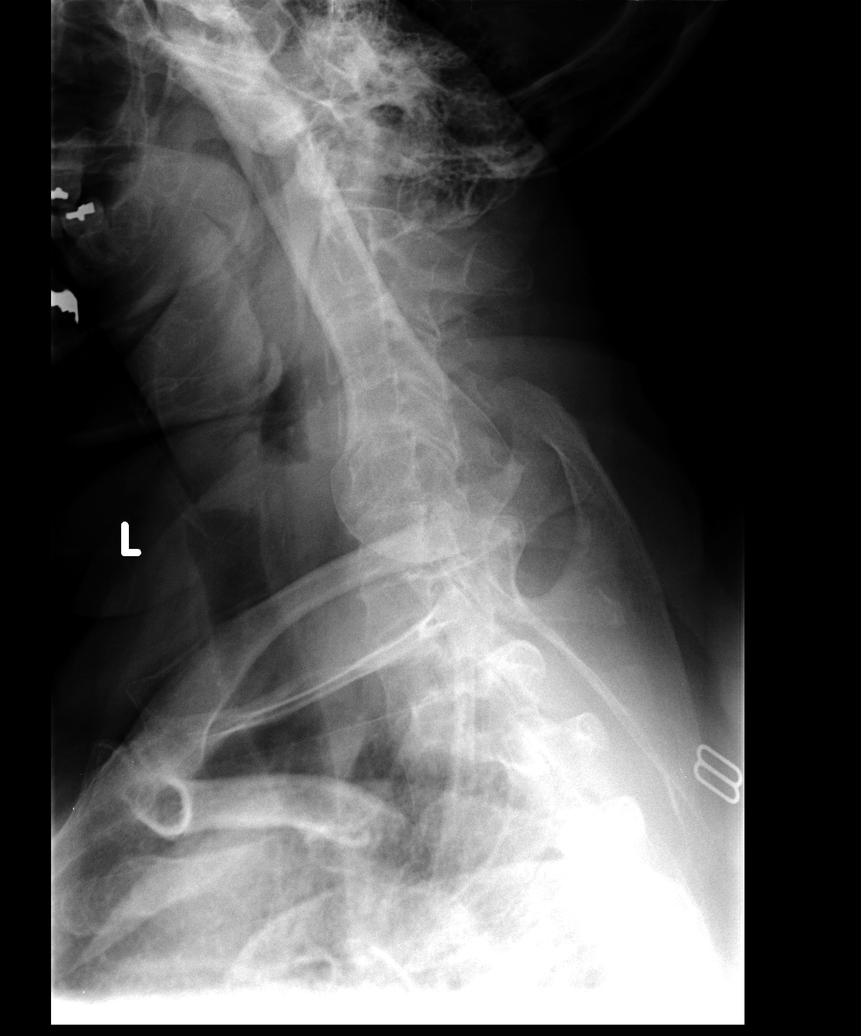

[6 of 6 positions shown; findings below may reference images not displayed]

FINDINGS: There is disc narrowing with degenerative spurring at C5-
6 and C6-7.  There is at least some degree of foraminal narrowing
at both levels.  No fracture or subluxation.  Soft tissues
unremarkable.
IMPRESSION: Degenerative disc disease with moderate spondylosis at C5-6 C6-7.
No acute findings.

## 2010-11-15 ENCOUNTER — Encounter: Payer: Self-pay | Admitting: *Deleted

## 2010-11-15 ENCOUNTER — Encounter: Payer: Self-pay | Admitting: Pulmonary Disease

## 2010-11-16 ENCOUNTER — Encounter: Payer: Self-pay | Admitting: Pulmonary Disease

## 2010-11-16 ENCOUNTER — Ambulatory Visit (INDEPENDENT_AMBULATORY_CARE_PROVIDER_SITE_OTHER): Payer: BC Managed Care – PPO | Admitting: Pulmonary Disease

## 2010-11-16 DIAGNOSIS — F17200 Nicotine dependence, unspecified, uncomplicated: Secondary | ICD-10-CM

## 2010-11-16 DIAGNOSIS — R69 Illness, unspecified: Secondary | ICD-10-CM

## 2010-11-16 DIAGNOSIS — J449 Chronic obstructive pulmonary disease, unspecified: Secondary | ICD-10-CM

## 2010-11-16 MED ORDER — VARENICLINE TARTRATE 0.5 MG X 11 & 1 MG X 42 PO MISC: ORAL | Status: AC

## 2010-11-16 MED ORDER — TIOTROPIUM BROMIDE MONOHYDRATE 18 MCG IN CAPS: 18.0000 ug | ORAL_CAPSULE | Freq: Every day | RESPIRATORY_TRACT | Status: DC

## 2010-11-16 NOTE — Progress Notes (Signed)
  Subjective:    Patient ID: Amanda Dean, female    DOB: 1950-08-03, 60 y.o.   MRN: [id number]  HPI 58/F, heavy smoker, about 48 Pyrs with COPD &  sleep related hypoxemia    Pulmonary History:  7/09 initial OV -Referred by Dr Jacinto Halim for evaluation of hypoxia on exertion & lying down.  Cardiac evaluation in the past for MCA CVA has revealed Atrial septal aneurysm with PFO but no significant Rt to Lt shunt, 1-2+ MR.  Spirometry >>FEV1 55%, no BD response  8/09>> cath data RA 14/11, RV 35/10, PA 35/13, PCWP 12, LA 15/18, CI 3.45. No L--> R shunt , able to cross PFO from RA to LA  echo >> nml LV fn, no ASD on bubble study  10/27 >> PFTs FEV1 61%, DLCO 71%, decreased lung volumes  CXR >> no evidence of ILD She underwent extensive neurological evaluation for memory deficits incl PSG 3/11 - showed desaturations for 29 mins out of 7 hrs of sleep.Feel sleep related hypoxemia is a combination of COPD & PFO , although shunt was not evident on prior studies. Trial of nocturnal O2 helped   11/16/2010 Annual FU, continues to smoke No episodes of bronchitis since 7/11 Compliant with O2 & this helps Using spiriva as needed only    Review of Systems Pt denies any significant  nasal congestion or excess secretions, fever, chills, sweats, unintended wt loss, pleuritic or exertional cp, orthopnea pnd or leg swelling.  Pt also denies any obvious fluctuation in symptoms with weather or environmental change or other alleviating or aggravating factors.    Pt denies any increase in rescue therapy over baseline, denies waking up needing it or having early am exacerbations or coughing/wheezing/ or dyspnea      Objective:   Physical Exam Gen. Pleasant, well-nourished, in no distress ENT - no lesions, no post nasal drip Neck: No JVD, no thyromegaly, no carotid bruits Lungs: no use of accessory muscles, no dullness to percussion, clear without rales or rhonchi  Cardiovascular: Rhythm regular, heart sounds   normal, no murmurs or gallops, no peripheral edema Musculoskeletal: No deformities, no cyanosis or clubbing         Assessment & Plan:

## 2010-11-16 NOTE — Patient Instructions (Signed)
Take spiriva as needed You HAVE to quit smoking - COPD is getting worse Continue using oxygen during sleep Trial of chantix + gum

## 2010-11-17 NOTE — Assessment & Plan Note (Signed)
Ct nocturnal O2

## 2010-11-17 NOTE — Assessment & Plan Note (Signed)
Has tried chantix, electronic cigarette int he past but after our discussion feels encouraged to make another quit attempt on her b'day . Will rx chnatix to increase chances of success

## 2010-11-17 NOTE — Assessment & Plan Note (Signed)
Spirometry >>FEV1 55%, no BD response  Gold stg 2 Get back on spiriva

## 2011-01-12 HISTORY — PX: NM MYOVIEW LTD: HXRAD82

## 2011-10-25 HISTORY — PX: US ECHOCARDIOGRAPHY: HXRAD669

## 2012-01-30 ENCOUNTER — Telehealth: Payer: Self-pay | Admitting: Pulmonary Disease

## 2012-01-30 DIAGNOSIS — J449 Chronic obstructive pulmonary disease, unspecified: Secondary | ICD-10-CM

## 2012-01-30 NOTE — Telephone Encounter (Signed)
I spoke with pt and she stated she never uses her oxygen at all. She has not been seen since 11/16/10 and told to f/u in 6 months. She has no pendning appt. Pt stated she just retired and it can take up to 6 weeks for her new insurance to take in affect. She will call back for appt once she receives new insurance card. In the meantime she still wants order to D/C her oxygen. Please advise Dr. Vassie Loll thanks

## 2012-01-31 NOTE — Telephone Encounter (Signed)
Ok to dc O2 Will reassess need on fu appt

## 2012-01-31 NOTE — Telephone Encounter (Signed)
I spoke with pt and aware order sent to d/c oxygen. She will call for appt once she gets new insurance cards.

## 2012-10-08 ENCOUNTER — Telehealth (HOSPITAL_COMMUNITY): Payer: Self-pay | Admitting: Cardiovascular Disease

## 2012-10-08 DIAGNOSIS — I639 Cerebral infarction, unspecified: Secondary | ICD-10-CM

## 2012-10-08 NOTE — Telephone Encounter (Signed)
Per OV note on 8.1.13, pt to have annual echocardiograms.  Order placed.  Please contact pt to schedule prior to next appt.

## 2012-10-08 NOTE — Telephone Encounter (Signed)
Pt called stating that she gets an echo done yearly, but there was not a recall or an order for this pt. Can you please check on that for me thank you.  [employer] MRN [id number].

## 2012-10-15 ENCOUNTER — Ambulatory Visit (HOSPITAL_COMMUNITY): Payer: BC Managed Care – PPO

## 2012-10-20 ENCOUNTER — Ambulatory Visit (HOSPITAL_COMMUNITY): Payer: BC Managed Care – PPO

## 2012-11-13 ENCOUNTER — Ambulatory Visit (HOSPITAL_COMMUNITY)
Admission: RE | Admit: 2012-11-13 | Discharge: 2012-11-13 | Disposition: A | Discharge status: Home / Self Care | Payer: BC Managed Care – PPO | Source: Ambulatory Visit | Attending: Cardiovascular Disease | Admitting: Cardiovascular Disease

## 2012-11-13 DIAGNOSIS — E785 Hyperlipidemia, unspecified: Secondary | ICD-10-CM | POA: Insufficient documentation

## 2012-11-13 DIAGNOSIS — I6350 Cerebral infarction due to unspecified occlusion or stenosis of unspecified cerebral artery: Secondary | ICD-10-CM | POA: Insufficient documentation

## 2012-11-13 DIAGNOSIS — I10 Essential (primary) hypertension: Secondary | ICD-10-CM | POA: Insufficient documentation

## 2012-11-13 DIAGNOSIS — I639 Cerebral infarction, unspecified: Secondary | ICD-10-CM

## 2012-11-13 DIAGNOSIS — I6789 Other cerebrovascular disease: Secondary | ICD-10-CM

## 2012-11-13 NOTE — Progress Notes (Signed)
2D Echo Performed 11/13/2012    Janique Hoefer, RCS  

## 2012-11-15 ENCOUNTER — Encounter: Payer: Self-pay | Admitting: *Deleted

## 2012-11-17 ENCOUNTER — Telehealth: Payer: Self-pay | Admitting: *Deleted

## 2012-11-17 NOTE — Telephone Encounter (Signed)
Message copied by Vita Barley on Mon Nov 17, 2012 12:03 PM ------      Message from: Thurmon Fair      Created: Thu Nov 13, 2012  4:06 PM       No significant abnormalities, PFO closure device is intact, no shunt ------

## 2012-11-17 NOTE — Telephone Encounter (Signed)
LMOM w/echo results.  Call back if any questions.

## 2012-11-20 ENCOUNTER — Encounter: Payer: Self-pay | Admitting: Cardiovascular Disease

## 2012-11-21 ENCOUNTER — Encounter: Payer: Self-pay | Admitting: Cardiovascular Disease

## 2012-11-21 ENCOUNTER — Ambulatory Visit (INDEPENDENT_AMBULATORY_CARE_PROVIDER_SITE_OTHER): Payer: BC Managed Care – PPO | Admitting: Cardiovascular Disease

## 2012-11-21 VITALS — BP 136/84 | HR 62 | Resp 16 | Ht 62.0 in | Wt 171.1 lb

## 2012-11-21 DIAGNOSIS — Z9889 Other specified postprocedural states: Secondary | ICD-10-CM

## 2012-11-21 DIAGNOSIS — F17200 Nicotine dependence, unspecified, uncomplicated: Secondary | ICD-10-CM

## 2012-11-21 DIAGNOSIS — Z8774 Personal history of (corrected) congenital malformations of heart and circulatory system: Secondary | ICD-10-CM

## 2012-11-21 DIAGNOSIS — Q2112 Patent foramen ovale: Secondary | ICD-10-CM

## 2012-11-21 DIAGNOSIS — J449 Chronic obstructive pulmonary disease, unspecified: Secondary | ICD-10-CM

## 2012-11-21 DIAGNOSIS — Q211 Atrial septal defect: Secondary | ICD-10-CM

## 2012-11-21 DIAGNOSIS — E785 Hyperlipidemia, unspecified: Secondary | ICD-10-CM

## 2012-11-21 DIAGNOSIS — Q2111 Secundum atrial septal defect: Secondary | ICD-10-CM

## 2012-11-21 DIAGNOSIS — R69 Illness, unspecified: Secondary | ICD-10-CM

## 2012-11-21 NOTE — Patient Instructions (Signed)
Your physician recommends that you schedule a follow-up appointment in: one year. Will schedule a follow up echo just before your next office visit.

## 2012-11-23 ENCOUNTER — Encounter: Payer: Self-pay | Admitting: Cardiovascular Disease

## 2012-11-23 DIAGNOSIS — Z8774 Personal history of (corrected) congenital malformations of heart and circulatory system: Secondary | ICD-10-CM | POA: Insufficient documentation

## 2012-11-23 NOTE — Assessment & Plan Note (Signed)
I don't think we have repeated tests for hypoxia after closure of her PFO, but from a symptomatic standpoint she has clear subjective improvement. She seems to have had a pattern of platypnea orthodoxiat which has resolved. Followup echo shows that her atrial septal occluder remains in place.

## 2012-11-23 NOTE — Assessment & Plan Note (Signed)
History of aneurysmal atrial septum with a large patent foramen ovale with right-to-left shunt. 28 mm Star flex device deployed January 2010, after she suffered a ischemic stroke, presumed paradoxical embolism. Current echo shows no evidence of right-to-left shunt.

## 2012-11-23 NOTE — Progress Notes (Signed)
Patient ID: Amanda Dean, female   DOB: 12/21/1950, 62 y.o.   MRN: [id number]     Reason for office visit Followup atrial septal closure device  Patient had a stroke of uncertain etiology, presumed to be due to paradoxical as the embolism to an aneurysmal atrial septum with a patent foramen ovale with right-to-left shunt. This happened in 2010. A fringe benefit of closing her PFO was the fact that she became less short of breath. Retrospectively she had symptoms suggestive of platypnea/orthodox you.  She feels well and has no active complaints except frustration at her lack of success in smoking cessation. She has come to the conclusion that it is critical that she stop smoking but is having a very hard time quitting. She seems to have to nicotine addiction as well as clear behavioral addiction. Numerous triggers for lighting a cigarette include driving, drinking coffee, watching television. She lives alone. There are no smokers around her  She has not had any new focal neurological deficits. She denies palpitations or syncope. She has not had chest pain or any change in a chronic pattern of shortness of breath (she has spirometry confirmed COPD).     No Known Allergies  Current Outpatient Prescriptions  Medication Sig Dispense Refill  . aspirin 81 MG tablet Take 81 mg by mouth daily.        . simvastatin (ZOCOR) 40 MG tablet Take 40 mg by mouth every evening.      . tiotropium (SPIRIVA) 18 MCG inhalation capsule Place 18 mcg into inhaler and inhale as needed.       No current facility-administered medications for this visit.    Past Medical History  Diagnosis Date  . CVA (cerebral infarction)     Lt MCA , 2001 failed cerebral angioplasty x 2  . Hyperlipidemia   . ASD (atrial septal defect)     PFO no significant shunt  . COPD (chronic obstructive pulmonary disease)   . Hypoxemia   . LVH (left ventricular hypertrophy)   . Coronary atherosclerosis   . Tobacco abuse     Past  Surgical History  Procedure Laterality Date  . Cerebral angioplasty    . Asd repair  05/05/2008    Dr. Michell Heinrich 28mm device  . Cardiac catheterization  12/11/2007    10-20% LUMINAL IRREGULARITY MID LAD, otherwise normal coronary arteries  . US echocardiography  10/25/2011    mod. concentric  LVH,EF 60-65%,trace MR  . Nm myoview ltd  01/12/2011    no ischemia    Family History  Problem Relation Age of Onset  . Prostate cancer Father   . Rheumatic fever Mother     History   Social History  . Marital Status: Single    Spouse Name: N/A    Number of Children: 3  . Years of Education: N/A   Occupational History  . Regulatory Specialist      [employer]   Social History Main Topics  . Smoking status: Current Every Day Smoker -- 1.00 packs/day for 35 years    Types: Cigarettes  . Smokeless tobacco: Not on file  . Alcohol Use: Yes     Comment: rarely  . Drug Use: No  . Sexually Active: Not on file   Other Topics Concern  . Not on file   Social History Narrative  . No narrative on file    Review of systems: The patient specifically denies any chest pain at rest or with exertion, dyspnea at rest, orthopnea, paroxysmal nocturnal dyspnea,  syncope, palpitations, focal neurological deficits, intermittent claudication, lower extremity edema, unexplained weight gain, cough, hemoptysis or wheezing.  The patient also denies abdominal pain, nausea, vomiting, dysphagia, diarrhea, constipation, polyuria, polydipsia, dysuria, hematuria, frequency, urgency, abnormal bleeding or bruising, fever, chills, unexpected weight changes, mood swings, change in skin or hair texture, change in voice quality, auditory or visual problems, allergic reactions or rashes, new musculoskeletal complaints other than usual "aches and pains".   PHYSICAL EXAM BP 136/84  Pulse 62  Resp 16  Ht 5\' 2"  (1.575 m)  Wt 171 lb 1.6 oz (77.61 kg)  BMI 31.29 kg/m2  General: Alert, oriented x3, no distress Head: no  evidence of trauma, PERRL, EOMI, no exophtalmos or lid lag, no myxedema, no xanthelasma; normal ears, nose and oropharynx Neck: normal jugular venous pulsations and no hepatojugular reflux; brisk carotid pulses without delay and no carotid bruits Chest: clear to auscultation, no signs of consolidation by percussion or palpation, normal fremitus, symmetrical and full respiratory excursions Cardiovascular: normal position and quality of the apical impulse, regular rhythm, normal first and second heart sounds, no murmurs, rubs or gallops Abdomen: no tenderness or distention, no masses by palpation, no abnormal pulsatility or arterial bruits, normal bowel sounds, no hepatosplenomegaly Extremities: no clubbing, cyanosis or edema; 2+ radial, ulnar and brachial pulses bilaterally; 2+ right femoral, posterior tibial and dorsalis pedis pulses; 2+ left femoral, posterior tibial and dorsalis pedis pulses; no subclavian or femoral bruits Neurological: grossly nonfocal   EKG: Sinus rhythm, possible left atrial enlargement  Lipid Panel  Performed by her PCP, we do not have those results at this time   ASSESSMENT AND PLAN TOBACCO ABUSE Although she successfully used Chantix in the remote past, when she tried again the medication made her unbearably nauseous. She had to stop taking it. Electronic cigarette has not helped to cut down on her use of real cigarettes. She is worried about using nicotine supplements but we discussed the way to use them safely. Also reviewed all the behavioral/lifestyle changes that will help her quit smoking permanently. She does appear to be  seriously interested in smoking cessation.  HYPOXEMIA HYPOXIA HYPOXIC I don't think we have repeated tests for hypoxia after closure of her PFO, but from a symptomatic standpoint she has clear subjective improvement. She seems to have had a pattern of platypnea orthodoxiat which has resolved. Followup echo shows that her atrial septal occluder  remains in place.  C O P D Informed by spirometry, this is one more good reason to quit smoking  HYPERLIPIDEMIA Need to get more recent lab results from her PCP  Status post device closure of ASD History of aneurysmal atrial septum with a large patent foramen ovale with right-to-left shunt. 28 mm Star flex device deployed January 2010, after she suffered a ischemic stroke, presumed paradoxical embolism. Current echo shows no evidence of right-to-left shunt.  Orders Placed This Encounter  Procedures  . EKG 12-Lead  . 2D Echocardiogram with contrast   Meds ordered this encounter  Medications  . tiotropium (SPIRIVA) 18 MCG inhalation capsule    Sig: Place 18 mcg into inhaler and inhale as needed.    Junious Silk, MD, [facility] [phone] office [phone] pager

## 2012-11-23 NOTE — Assessment & Plan Note (Signed)
Informed by spirometry, this is one more good reason to quit smoking

## 2012-11-23 NOTE — Assessment & Plan Note (Signed)
Need to get more recent lab results from her PCP

## 2012-11-23 NOTE — Assessment & Plan Note (Signed)
Although she successfully used Chantix in the remote past, when she tried again the medication made her unbearably nauseous. She had to stop taking it. Electronic cigarette has not helped to cut down on her use of real cigarettes. She is worried about using nicotine supplements but we discussed the way to use them safely. Also reviewed all the behavioral/lifestyle changes that will help her quit smoking permanently. She does appear to be  seriously interested in smoking cessation.

## 2018-08-14 ENCOUNTER — Other Ambulatory Visit: Payer: Self-pay | Admitting: Cardiology

## 2018-08-14 DIAGNOSIS — I6523 Occlusion and stenosis of bilateral carotid arteries: Secondary | ICD-10-CM

## 2018-10-14 ENCOUNTER — Telehealth: Payer: Self-pay

## 2018-10-14 NOTE — Telephone Encounter (Signed)
Pt called stating that she she went to urgent care last week for sob and has been treated but is still not feeling well. Wants to know if you can see her tomorrow to listen to her heart when she comes in for testing-echo tomorrow @ 10:30. Please review and advise.//ah

## 2018-10-14 NOTE — Telephone Encounter (Signed)
Make a OV encounter also please and I will try to read the study as well as long as she is willing to wait

## 2018-10-15 ENCOUNTER — Encounter: Payer: Self-pay | Admitting: Cardiology

## 2018-10-15 ENCOUNTER — Ambulatory Visit (INDEPENDENT_AMBULATORY_CARE_PROVIDER_SITE_OTHER): Payer: Medicare Other | Admitting: Cardiology

## 2018-10-15 ENCOUNTER — Ambulatory Visit: Payer: Medicare Other | Admitting: Cardiology

## 2018-10-15 ENCOUNTER — Ambulatory Visit (INDEPENDENT_AMBULATORY_CARE_PROVIDER_SITE_OTHER): Payer: Medicare Other

## 2018-10-15 ENCOUNTER — Other Ambulatory Visit: Payer: Self-pay

## 2018-10-15 VITALS — BP 144/74 | HR 86 | Ht 62.0 in | Wt 181.7 lb

## 2018-10-15 DIAGNOSIS — I10 Essential (primary) hypertension: Secondary | ICD-10-CM

## 2018-10-15 DIAGNOSIS — R0609 Other forms of dyspnea: Secondary | ICD-10-CM

## 2018-10-15 DIAGNOSIS — R0789 Other chest pain: Secondary | ICD-10-CM

## 2018-10-15 DIAGNOSIS — Z8673 Personal history of transient ischemic attack (TIA), and cerebral infarction without residual deficits: Secondary | ICD-10-CM

## 2018-10-15 DIAGNOSIS — I6523 Occlusion and stenosis of bilateral carotid arteries: Secondary | ICD-10-CM | POA: Diagnosis not present

## 2018-10-15 NOTE — Progress Notes (Signed)
Primary Physician/Referring:  Dortha Kern, MD  Patient ID: Amanda Dean, female    DOB: Jun 21, 1950, 68 y.o.   MRN: [id number]  Chief Complaint  Patient presents with  . Chest Pain  . Carotid    Stenosis and duplex follow-up    HPI: Amanda Dean  is a 68 y.o. female  with history of stroke, S/P ASD repair with 28-mm Starflex septal occluder closure of the ASD 05/05/2008, history of tobacco use disorder with COPD and quit smoking in 2015, hypertension, asymptomatic carotid artery stenosis and hyperlipidemia presents here for 6 month office visit and follow-up of carotid stenosis.  She underwent carotid duplex today, but also requested me to see her as she had chest discomfort and went to the emergency room a week ago.  Chest discomfort described as sharp pain under the left breast area, last a few seconds to a few minutes, no exertional complement, no other associated symptoms.  No hemoptysis, no worsening dyspnea, no leg edema.  Past Medical History:  Diagnosis Date  . ASD (atrial septal defect)    PFO no significant shunt  . COPD (chronic obstructive pulmonary disease) ([place])   . CVA (cerebral infarction)    Lt MCA , 2001 failed cerebral angioplasty x 2  . Hyperlipidemia   . Hypoxemia   . LVH (left ventricular hypertrophy)   . Tobacco abuse     Past Surgical History:  Procedure Laterality Date  . ASD REPAIR  05/05/2008   Dr. Ophelia Shoulder 30m device  . CARDIAC CATHETERIZATION  12/11/2007   10-20% LUMINAL IRREGULARITY MID LAD, otherwise normal coronary arteries  . Cerebral angioplasty    . NM MYOVIEW LTD  01/12/2011   no ischemia  . [place]  10/25/2011   mod. concentric  LVH,EF 60-65%,trace MR    Social History   Socioeconomic History  . Marital status: Single    Spouse name: Not on file  . Number of children: 3  . Years of education: Not on file  . Highest education level: Not on file  Occupational History  . Occupation: RLexicographer     Comment: [employer] . Financial resource strain: Not on file  . Food insecurity    Worry: Not on file    Inability: Not on file  . Transportation needs    Medical: Not on file    Non-medical: Not on file  Tobacco Use  . Smoking status: Former Smoker    Packs/day: 1.00    Years: 35.00    Pack years: 35.00    Types: Cigarettes    Quit date: 06/17/2013    Years since quitting: 5.3  . Smokeless tobacco: Never Used  Substance and Sexual Activity  . Alcohol use: Yes    Alcohol/week: 14.0 standard drinks    Types: 14 Glasses of wine per week    Comment: rarely  . Drug use: No  . Sexual activity: Not on file  Lifestyle  . Physical activity    Days per week: Not on file    Minutes per session: Not on file  . Stress: Not on file  Relationships  . Social [place] phone: Not on file    Gets together: Not on file    Attends religious service: Not on file    Active member of club or organization: Not on file    Attends meetings of clubs or organizations: Not on file    Relationship status: Not on file  .  Intimate partner violence    Fear of current or ex partner: Not on file    Emotionally abused: Not on file    Physically abused: Not on file    Forced sexual activity: Not on file  Other Topics Concern  . Not on file  Social History Narrative  . Not on file    Review of Systems  Constitution: Positive for weight gain (10-15 lbs since COVID 19). Negative for chills, decreased appetite and malaise/fatigue.  Cardiovascular: Positive for chest pain and dyspnea on exertion (chronic). Negative for leg swelling and syncope.  Endocrine: Negative for cold intolerance.  Hematologic/Lymphatic: Does not bruise/bleed easily.  Musculoskeletal: Negative for joint swelling.  Gastrointestinal: Negative for abdominal pain, anorexia, change in bowel habit, hematochezia and melena.  Neurological: Negative for headaches and light-headedness.  Psychiatric/Behavioral:  Negative for depression and substance abuse.  All other systems reviewed and are negative.     Objective  Blood pressure (!) 144/74, pulse 86, height 5' 2"  (1.575 m), weight 181 lb 11.2 oz (82.4 kg), SpO2 96 %. Body mass index is 33.23 kg/m.    Physical Exam  Constitutional: She appears well-developed. No distress.  Mildly obese  HENT:  Head: Atraumatic.  Eyes: Conjunctivae are normal.  Neck: Neck supple. No JVD present. No thyromegaly present.  Cardiovascular: Normal rate, regular rhythm, S1 normal, S2 normal, intact distal pulses and normal pulses. Exam reveals no gallop.  Murmur heard.  Early systolic murmur is present with a grade of 2/6 at the upper right sternal border. Pulses:      Carotid pulses are on the right side with bruit and on the left side with bruit. Pulmonary/Chest: Effort normal. She has rales (occasional scattered crackles).  Mild tenderness left 4-5th ribs in mid axillary region  Abdominal: Soft. Bowel sounds are normal.  Musculoskeletal: Normal range of motion.        General: No edema.  Neurological: She is alert.  Skin: Skin is warm and dry.  Psychiatric: She has a normal mood and affect.   Radiology: Pcv Carotid Duplex (bilateral)  Result Date: 10/15/2018 Carotid artery duplex  10/15/2018: Stenosis in the right internal carotid artery (16-49%). Minimal stenosis in the left internal carotid artery (1-15%). Antegrade right vertebral artery flow. Antegrade left vertebral artery flow. Compared to the study done on 01/15/2018, bilateral ICA stenosis has decreased, right from 50-69% range to less than 50% range and left 16-49% range to less than 15% range. Follow up in one year is appropriate if clinically indicated.   Laboratory examination:   Labs 10/05/2018: BUN 19, creatinine 0.65, eGFR greater than 60 mL.  CMP otherwise normal.  Serum calcium was minimally elevated for the 1st time at 10.7.  D-dimer normal.  04/17/2018: Cholesterol 140, triglycerides  60, HDL 63, LDL 65. Creatinine 0.61, eGFR 93/108, Potassium 4.6, CMP normal.   No flowsheet data found. CBC Latest Ref Rng & Units 05/06/2008 05/05/2008  WBC 4.0 - 10.5 K/uL 8.9 8.2  Hemoglobin 12.0 - 15.0 g/dL 12.9 13.6  Hematocrit 36.0 - 46.0 % 37.5 40.3  Platelets 150 - 400 K/uL 199 186    Medications   Medications Discontinued During This Encounter  Medication Reason  . simvastatin (ZOCOR) 40 MG tablet Error  . tiotropium (SPIRIVA) 18 MCG inhalation capsule Error   Current Meds  Medication Sig  . albuterol (VENTOLIN HFA) 108 (90 Base) MCG/ACT inhaler Inhale into the lungs.  Marland Kitchen amLODipine (NORVASC) 5 MG tablet Take by mouth.  Marland Kitchen aspirin 81 MG tablet  Take 81 mg by mouth daily.    Marland Kitchen doxycycline (VIBRA-TABS) 100 MG tablet Take by mouth.  . rosuvastatin (CRESTOR) 10 MG tablet   . spironolactone (ALDACTONE) 25 MG tablet     Cardiac Studies:   Carotid artery duplex  10/15/2018: Stenosis in the right internal carotid artery (16-49%). Minimal stenosis in the left internal carotid artery (1-15%). Antegrade right vertebral artery flow. Antegrade left vertebral artery flow. Compared to the study done on 01/15/2018, bilateral ICA stenosis has decreased, right from 50-69% range to less than 50% range and left 16-49% range to less than 15% range. Follow up in one year is appropriate if clinically indicated.  Echo- 01/15/2018 1. Left ventricle cavity is normal in size. Moderate concentric hypertrophy of the left ventricle. Normal global wall motion. Visual EF is 65-70%. Doppler evidence of grade II (pseudonormal) diastolic dysfunction. 2. Left atrial cavity is mild to moderately dilated. Atrial septal occlude is in place with minimal residual leak by color doppler. 3. Mild (Grade I) mitral regurgitation. 4. Trace tricuspid regurgitation. 5. No significant change c.f. echo. of 06/22/2015   Lexiscan sestamibi stress test 11/09/2013:  1. The resting electrocardiogram demonstrated normal sinus  rhythm, no resting arrhythmias and nonspecific ST-T changes. Cannot r/o inferior and antererolateral ischemia. Stress EKG is nondiagnostic for ischemia as it low pharmacologic stress test. Stress symptoms included shortness of breath. 2. Myocardial perfusion imaging is normal. Overall left ventricular systolic function was normal without regional wall motion abnormalities. The left ventricular ejection fraction was calculated or visually estimated to be 58%.  Assessment   Chest pain, musculoskeletal   H/O: CVA (cerebrovascular accident) 2001 without deficits - Plan: CANCELED: EKG 12-Lead  Asymptomatic bilateral carotid artery stenosis - Plan: PCV CAROTID DUPLEX (BILATERAL)  Essential hypertension  Dyspnea on exertion    Outside EKG 10/05/18: Sinus rhythm with sinus arrhythmia. Possible Left atrial enlargement. Incomplete right bundle branch block LVH with strain. Cannot rule out Septal infarct , age undetermined. No significant change from  EKG 12/05/2017.  Recommendations:   Patient's symptoms of chest pain appeared to be at most atypical and probably muscular skeletal.  Last nuclear stress test was in 2015.  If symptoms persist will consider Lexiscan stress test.  Blood pressure is elevated today, patient states that he has been well-controlled and she has been extremely nervous and anxious.  Carotid artery duplex was discussed with the patient.  Carotid stenosis appears to have improved compared to 6 months ago.  We will continue annual surveillance. No new symptoms of stroke. Lipids are not well controlled.  She is tolerating Crestor.  With regard to dyspnea, related to underlying COPD, she is remained abstinent from tobacco.  I'll see her back in a year.  Adrian Prows, MD, [facility] 10/16/2018, 7:07 AM [facility] [place] Pager: [phone] Office: [phone] If no answer Cell [phone]

## 2018-10-15 NOTE — Telephone Encounter (Signed)
Pt and front staff aware.//ah

## 2018-10-16 ENCOUNTER — Encounter: Payer: Self-pay | Admitting: Cardiology

## 2018-11-12 ENCOUNTER — Ambulatory Visit: Payer: Medicare Other | Admitting: Cardiology

## 2019-03-19 ENCOUNTER — Other Ambulatory Visit: Payer: Self-pay | Admitting: Cardiology

## 2019-03-19 DIAGNOSIS — I10 Essential (primary) hypertension: Secondary | ICD-10-CM

## 2019-03-19 DIAGNOSIS — R0609 Other forms of dyspnea: Secondary | ICD-10-CM

## 2019-03-22 ENCOUNTER — Other Ambulatory Visit: Payer: Self-pay | Admitting: Cardiology

## 2019-03-25 ENCOUNTER — Other Ambulatory Visit: Payer: Medicare Other

## 2019-04-03 ENCOUNTER — Other Ambulatory Visit: Payer: Self-pay

## 2019-04-03 ENCOUNTER — Ambulatory Visit (INDEPENDENT_AMBULATORY_CARE_PROVIDER_SITE_OTHER): Payer: Medicare Other

## 2019-04-03 DIAGNOSIS — I10 Essential (primary) hypertension: Secondary | ICD-10-CM | POA: Diagnosis not present

## 2019-04-03 DIAGNOSIS — R0609 Other forms of dyspnea: Secondary | ICD-10-CM

## 2019-10-08 ENCOUNTER — Telehealth: Payer: Self-pay

## 2019-10-08 NOTE — Telephone Encounter (Signed)
Patient asking if she should take her bp medication if bp is running between 113/55 and 111/61.  Hr 52. I advised patient in the future to contact you through [facility] for non emergency advise.

## 2019-10-21 ENCOUNTER — Other Ambulatory Visit: Payer: Self-pay

## 2019-10-21 ENCOUNTER — Ambulatory Visit: Payer: Medicare Other | Admitting: Cardiology

## 2019-10-21 ENCOUNTER — Ambulatory Visit: Payer: Medicare Other

## 2019-10-21 ENCOUNTER — Encounter: Payer: Self-pay | Admitting: Cardiology

## 2019-10-21 VITALS — BP 140/64 | HR 59 | Resp 15 | Ht 62.0 in | Wt 184.8 lb

## 2019-10-21 DIAGNOSIS — E7800 Pure hypercholesterolemia, unspecified: Secondary | ICD-10-CM

## 2019-10-21 DIAGNOSIS — I6523 Occlusion and stenosis of bilateral carotid arteries: Secondary | ICD-10-CM

## 2019-10-21 DIAGNOSIS — I6521 Occlusion and stenosis of right carotid artery: Secondary | ICD-10-CM

## 2019-10-21 DIAGNOSIS — I10 Essential (primary) hypertension: Secondary | ICD-10-CM

## 2019-10-21 DIAGNOSIS — Z8673 Personal history of transient ischemic attack (TIA), and cerebral infarction without residual deficits: Secondary | ICD-10-CM

## 2019-10-21 NOTE — Progress Notes (Signed)
Primary Physician/Referring:  [name], NP  Patient ID: Amanda Dean, female    DOB: 1950-12-06, 69 y.o.   MRN: [id number]  Chief Complaint  Patient presents with  . Follow-up    1 year  . Carotid Stenosis  . Hyperlipidemia    HPI: Amanda Dean  is a 69 y.o. female  with history of stroke, S/P ASD repair with 28-mm Starflex septal occluder closure of the ASD 05/05/2008, history of tobacco use disorder with COPD and quit smoking in 2015, hypertension, asymptomatic carotid artery stenosis and hyperlipidemia presents here for 1 year follow up.   She underwent carotid duplex today, except for chronic dyspnea she has no specific symptoms.  She has developed some pain in her left side of her neck and not complete her left carotid check completely.  No chest pain, no leg edema, no PND or orthopnea.  She had emailed me about her blood pressure recordings at home, have been under excellent control.  Past Medical History:  Diagnosis Date  . ASD (atrial septal defect)    PFO no significant shunt  . COPD (chronic obstructive pulmonary disease) ([place])   . CVA (cerebral infarction)    Lt MCA , 2001 failed cerebral angioplasty x 2  . Hyperlipidemia   . Hypoxemia   . LVH (left ventricular hypertrophy)   . Tobacco abuse     Past Surgical History:  Procedure Laterality Date  . ASD REPAIR  05/05/2008   Dr. Ophelia Shoulder 78m device  . CARDIAC CATHETERIZATION  12/11/2007   10-20% LUMINAL IRREGULARITY MID LAD, otherwise normal coronary arteries  . Cerebral angioplasty    . NM MYOVIEW LTD  01/12/2011   no ischemia  . [place]CH[place]ARDIOGRAPHY  10/25/2011   mod. concentric  LVH,EF 60-65%,trace MR   Social History   Tobacco Use  . Smoking status: Former Smoker    Packs/day: 1.00    Years: 35.00    Pack years: 35.00    Types: Cigarettes    Quit date: 06/17/2013    Years since quitting: 6.3  . Smokeless tobacco: Never Used  Substance Use Topics  . Alcohol use: Yes    Alcohol/week: 14.0 standard  drinks    Types: 14 Glasses of wine per week    Comment: rarely   Marital Status: Single   Review of Systems  Cardiovascular: Positive for dyspnea on exertion. Negative for chest pain and leg swelling.  Musculoskeletal: Positive for joint pain and neck pain.  Gastrointestinal: Negative for melena.   Objective  Blood pressure 140/64, pulse (!) 59, resp. rate 15, height 5' 2"  (1.575 m), weight 184 lb 12.8 oz (83.8 kg), SpO2 96 %. Body mass index is 33.8 kg/m.    Physical Exam Constitutional:      General: She is not in acute distress.    Appearance: She is well-developed.     Comments: Mildly obese  Eyes:     Conjunctiva/sclera: Conjunctivae normal.  Neck:     Thyroid: No thyromegaly.     Vascular: No JVD.  Cardiovascular:     Rate and Rhythm: Normal rate and regular rhythm.     Pulses: Intact distal pulses.          Carotid pulses are on the left side with bruit.      Dorsalis pedis pulses are 1+ on the right side and 1+ on the left side.       Posterior tibial pulses are 2+ on the right side and 2+ on the left side.  Heart sounds: S1 normal and S2 normal. Murmur heard.  Early systolic murmur is present with a grade of 2/6 at the upper right sternal border.  No gallop.   Pulmonary:     Effort: Pulmonary effort is normal.     Breath sounds: Rales (occasional scattered crackles) present.  Abdominal:     General: Bowel sounds are normal.     Palpations: Abdomen is soft.  Musculoskeletal:     Cervical back: Neck supple.    Radiology: No results found.  Laboratory examination:   No flowsheet data found. CBC Latest Ref Rng & Units 05/06/2008 05/05/2008  WBC 4.0 - 10.5 K/uL 8.9 8.2  Hemoglobin 12.0 - 15.0 g/dL 12.9 13.6  Hematocrit 36 - 46 % 37.5 40.3  Platelets 150 - 400 K/uL 199 186   External labs:   Lab 06/19/2019:  Sodium 139, potassium 4.0, BUN 23, creatinine 0.68, EGFR >60 mL, serum glucose 95 mg.  TSH normal.  Hb 13.1/HCT 38.2, platelets 213.  Labs  10/05/2018:   BUN 19, creatinine 0.65, eGFR greater than 60 mL.  CMP otherwise normal.  Serum calcium was minimally elevated for the 1st time at 10.7.  D-dimer normal.  04/17/2018: Cholesterol 140, triglycerides 60, HDL 63, LDL 65. Creatinine 0.61, eGFR 93/108, Potassium 4.6, CMP normal.  Medications   Medications Discontinued During This Encounter  Medication Reason  . doxycycline (VIBRA-TABS) 100 MG tablet Patient has not taken in last 30 days   Current Meds  Medication Sig  . amLODipine (NORVASC) 5 MG tablet TAKE 1 TABLET DAILY  . aspirin 81 MG tablet Take 81 mg by mouth daily.    . rosuvastatin (CRESTOR) 10 MG tablet TAKE 1 TABLET DAILY  . spironolactone (ALDACTONE) 25 MG tablet TAKE 1 TABLET EVERY MORNING  . [DISCONTINUED] doxycycline (VIBRA-TABS) 100 MG tablet Take by mouth.     Cardiac Studies:   Echocardiogram 04/03/2019:  Left ventricle cavity is normal in size. Moderate concentric hypertrophy  of the left ventricle. Normal LV systolic function with EF 55%. Normal  global wall motion. Doppler evidence of grade I (impaired) diastolic  dysfunction, normal LAP.  Left atrial cavity is mildly dilated.  Mild (Grade I) mitral regurgitation.  Mild tricuspid regurgitation.  No evidence of pulmonary hypertension.  No significant change compared to previous study on 01/15/2018.   Lexiscan sestamibi stress test 11/09/2013:  1. The resting electrocardiogram demonstrated normal sinus rhythm, no resting arrhythmias and nonspecific ST-T changes. Cannot r/o inferior and antererolateral ischemia. Stress EKG is nondiagnostic for ischemia as it low pharmacologic stress test. Stress symptoms included shortness of breath. 2. Myocardial perfusion imaging is normal. Overall left ventricular systolic function was normal without regional wall motion abnormalities. The left ventricular ejection fraction was calculated or visually estimated to be 58%.  Carotid artery duplex  10/21/2019  Stenosis in  the right internal carotid artery (50-69%). Stenosis in the right external carotid artery (<50%). Peak systolic velocities in the left bifurcation, internal, external and common carotid arteries are within normal limits. Antegrade right vertebral artery flow. Left vertebral artery flow is not visualized. Follow up in six months is appropriate if clinically indicated. Compared to 10/15/2018, right ICA stenosis has progressed from <50%.  EKG:  EKG 10/21/2019: Sinus bradycardia at rate of 55 bpm, normal axis, LVH with repolarization abnormality, cannot exclude inferior and anterolateral ischemia.  Q in aVL and I, cannot exclude high lateral inferct old. Early R transition, consider RVH.  Consider to posterior infarct. No significant change from 10/05/2018.  Assessment     ICD-10-CM   1. Asymptomatic stenosis of right carotid artery  I65.21 PCV CAROTID DUPLEX (BILATERAL)  2. Essential hypertension  I10 EKG 12-Lead  3. H/O: CVA (cerebrovascular accident) 2001 without deficits  Z86.73   4. Hypercholesteremia  E78.00 Lipid Panel With LDL/HDL Ratio    Recommendations:   Katura Eatherly  is a 69 y.o. female  with history of stroke, S/P ASD repair with 28-mm Starflex septal occluder closure of the ASD 05/05/2008, history of tobacco use disorder with COPD and quit smoking in 2015, hypertension, asymptomatic carotid artery stenosis and hyperlipidemia presents here for 1 year follow up.   She presented remains asymptomatic, has developed left-sided neck pain.  Her blood pressure has been very well controlled by home recordings, today it was elevated probably due to neck pain.  I did not make any changes to her medications as her blood pressure at times has been soft.  Her lipids have been previously well controlled, has not been recently checked, will obtain lipid profile testing today. WIll be aggressive in LDL reduction in view of progression of carotid stenosis.  With regard to carotid artery duplex, mild  progression of the right ICA stenosis, will recheck in 6 months.  Otherwise she remains stable, I will see her back in a year.   Adrian Prows, MD, [facility] 10/21/2019, 9:49 PM Office: [phone]

## 2019-10-22 LAB — LIPID PANEL WITH LDL/HDL RATIO
Cholesterol, Total: 162 mg/dL (ref 100–199)
HDL: 71 mg/dL (ref >39)
LDL Chol Calc (NIH): 80 mg/dL (ref 0–99)
LDL/HDL Ratio: 1.1 ratio (ref 0.0–3.2)
Triglycerides: 56 mg/dL (ref 0–149)
VLDL Cholesterol Cal: 11 mg/dL (ref 5–40)

## 2019-10-26 ENCOUNTER — Other Ambulatory Visit: Payer: Self-pay | Admitting: Cardiology

## 2019-10-26 DIAGNOSIS — I6523 Occlusion and stenosis of bilateral carotid arteries: Secondary | ICD-10-CM

## 2019-10-26 DIAGNOSIS — E7800 Pure hypercholesterolemia, unspecified: Secondary | ICD-10-CM

## 2019-10-26 MED ORDER — ROSUVASTATIN CALCIUM 20 MG PO TABS: 20.0000 mg | ORAL_TABLET | Freq: Every day | ORAL | 3 refills | Status: DC

## 2020-01-23 ENCOUNTER — Other Ambulatory Visit: Payer: Self-pay | Admitting: Cardiology

## 2020-01-25 ENCOUNTER — Other Ambulatory Visit: Payer: Self-pay

## 2020-01-25 DIAGNOSIS — I6523 Occlusion and stenosis of bilateral carotid arteries: Secondary | ICD-10-CM

## 2020-01-25 DIAGNOSIS — E7800 Pure hypercholesterolemia, unspecified: Secondary | ICD-10-CM

## 2020-01-25 MED ORDER — ROSUVASTATIN CALCIUM 20 MG PO TABS: 20.0000 mg | ORAL_TABLET | Freq: Every day | ORAL | 3 refills | Status: DC

## 2020-01-29 LAB — LIPID PANEL WITH LDL/HDL RATIO
Cholesterol, Total: 144 mg/dL (ref 100–199)
HDL: 70 mg/dL (ref >39)
LDL Chol Calc (NIH): 62 mg/dL (ref 0–99)
LDL/HDL Ratio: 0.9 ratio (ref 0.0–3.2)
Triglycerides: 54 mg/dL (ref 0–149)
VLDL Cholesterol Cal: 12 mg/dL (ref 5–40)

## 2020-02-24 NOTE — Telephone Encounter (Signed)
From pt

## 2020-03-31 DIAGNOSIS — E7800 Pure hypercholesterolemia, unspecified: Secondary | ICD-10-CM

## 2020-04-01 MED ORDER — EZETIMIBE-SIMVASTATIN 10-20 MG PO TABS: 1.0000 | ORAL_TABLET | Freq: Every day | ORAL | 6 refills | Status: DC

## 2020-04-01 NOTE — Telephone Encounter (Signed)
From pt

## 2020-04-01 NOTE — Telephone Encounter (Signed)
Patient having "fogginess" and decreased memory since being on Crestor, would like to try Vytorin, 10/20 mg every afternoon prescription sent.    ICD-10-CM   1. Hypercholesteremia  E78.00 ezetimibe-simvastatin (VYTORIN) 10-20 MG tablet    Yates Decamp, MD, [facility] 04/01/2020, 1:02 PM Office: [phone] Pager: [phone]

## 2020-04-15 ENCOUNTER — Other Ambulatory Visit: Payer: Self-pay

## 2020-04-15 ENCOUNTER — Ambulatory Visit: Payer: Medicare Other

## 2020-04-15 DIAGNOSIS — I6521 Occlusion and stenosis of right carotid artery: Secondary | ICD-10-CM

## 2020-04-17 ENCOUNTER — Other Ambulatory Visit: Payer: Self-pay | Admitting: Cardiology

## 2020-04-17 DIAGNOSIS — I6523 Occlusion and stenosis of bilateral carotid arteries: Secondary | ICD-10-CM

## 2020-04-18 ENCOUNTER — Other Ambulatory Visit: Payer: Medicare Other

## 2020-09-29 ENCOUNTER — Other Ambulatory Visit: Payer: Self-pay

## 2020-09-29 ENCOUNTER — Encounter: Payer: Self-pay | Admitting: Emergency Medicine

## 2020-09-29 ENCOUNTER — Emergency Department (INDEPENDENT_AMBULATORY_CARE_PROVIDER_SITE_OTHER)
Admission: EM | Admit: 2020-09-29 | Discharge: 2020-09-29 | Disposition: A | Discharge status: Home / Self Care | Payer: Medicare Other | Source: Home / Self Care

## 2020-09-29 DIAGNOSIS — R21 Rash and other nonspecific skin eruption: Secondary | ICD-10-CM

## 2020-09-29 DIAGNOSIS — R0602 Shortness of breath: Secondary | ICD-10-CM

## 2020-09-29 MED ORDER — PREDNISONE 20 MG PO TABS: ORAL_TABLET | ORAL | 0 refills | Status: DC

## 2020-09-29 MED ORDER — METHYLPREDNISOLONE ACETATE 80 MG/ML IJ SUSP
80.0000 mg | Freq: Once | INTRAMUSCULAR | Status: AC
  Administered 2020-09-29: 80 mg via INTRAMUSCULAR

## 2020-09-29 NOTE — Progress Notes (Signed)
Primary Physician/Referring:  [name], NP  Patient ID: Amanda Dean, female    DOB: Jul 06, 1950, 70 y.o.   MRN: [id number]  Chief Complaint  Patient presents with  . Congestive Heart Failure  . Asymptomatic bilateral carotid artery stenosis   . Shortness of Breath  . Leg Swelling    HPI: Amanda Dean  is a 70 y.o. female  with history of stroke, S/P ASD repair with 28-mm Starflex septal occluder closure of the ASD 05/05/2008, history of tobacco use disorder (quit in 2015) with COPD and quit smoking in 2015, hypertension, asymptomatic carotid artery stenosis and hyperlipidemia. Patient also has history of anxiety.   She presents for annual visit and follow-up of carotid artery stenosis, hypertension, hyperlipidemia.  Patient has concerns of dyspnea on exertion and reduced exercise tolerance over the last 1 month.  She has also noticed increased bilateral ankle swelling during the same period of time.  She notes he has had an injury in the past to the left ankle, however now her right ankle swelling.  Patient states bilateral ankle swelling worsens throughout the day and improves with elevation.  She also complains of intermittent chest discomfort which she describes as heaviness lasting approximately 30 seconds without identifiable triggers and not associated with exertion.   She is also concerned that she feels her statin medication is causing significant hair loss.  Past Medical History:  Diagnosis Date  . ASD (atrial septal defect)    PFO no significant shunt  . COPD (chronic obstructive pulmonary disease) ([place])   . CVA (cerebral infarction)    Lt MCA , 2001 failed cerebral angioplasty x 2  . Hyperlipidemia   . Hypoxemia   . LVH (left ventricular hypertrophy)   . Tobacco abuse     Past Surgical History:  Procedure Laterality Date  . ASD REPAIR  05/05/2008   Dr. Ophelia Shoulder 66m device  . CARDIAC CATHETERIZATION  12/11/2007   10-20% LUMINAL IRREGULARITY MID LAD, otherwise  normal coronary arteries  . Cerebral angioplasty    . NM MYOVIEW LTD  01/12/2011   no ischemia  . [place]GRAPHY  10/25/2011   mod. concentric  LVH,EF 60-65%,trace MR   Family History  Problem Relation Age of Onset  . Rheumatic fever Mother   . Prostate cancer Father     Social History   Tobacco Use  . Smoking status: Former Smoker    Packs/day: 1.00    Years: 35.00    Pack years: 35.00    Types: Cigarettes    Quit date: 06/17/2013    Years since quitting: 7.3  . Smokeless tobacco: Never Used  Substance Use Topics  . Alcohol use: Yes    Alcohol/week: 6.0 standard drinks    Types: 3 Glasses of wine, 3 Cans of beer per week    Comment: rarely   Marital Status: Single   ROS   Review of Systems  Constitutional: Negative for malaise/fatigue and weight gain.  Cardiovascular: Positive for chest pain and dyspnea on exertion. Negative for claudication, leg swelling, near-syncope, orthopnea, palpitations, paroxysmal nocturnal dyspnea and syncope.  Respiratory: Negative for shortness of breath.   Hematologic/Lymphatic: Does not bruise/bleed easily.  Musculoskeletal: Positive for joint pain and neck pain.  Gastrointestinal: Negative for melena.  Neurological: Negative for dizziness and weakness.   Objective  Blood pressure 128/72, pulse 71, temperature 98.2 F (36.8 C), height 5' 2"  (1.575 m), weight 189 lb 6.4 oz (85.9 kg), SpO2 95 %. Body mass index is 34.64 kg/m.  Physical Exam Constitutional:      General: She is not in acute distress.    Appearance: She is well-developed.     Comments: Mildly obese  Neck:     Thyroid: No thyromegaly.     Vascular: No JVD.  Cardiovascular:     Rate and Rhythm: Normal rate and regular rhythm.     Pulses: Intact distal pulses.          Carotid pulses are on the left side with bruit.      Dorsalis pedis pulses are 1+ on the right side and 1+ on the left side.       Posterior tibial pulses are 2+ on the right side and 2+ on the  left side.     Heart sounds: S1 normal and S2 normal. Murmur heard.   Early systolic murmur is present with a grade of 2/6 at the upper right sternal border. No gallop.   Pulmonary:     Effort: Pulmonary effort is normal.     Breath sounds: No rales.  Musculoskeletal:     Cervical back: Neck supple.     Laboratory examination:   No flowsheet data found. CBC Latest Ref Rng & Units 05/06/2008 05/05/2008  WBC 4.0 - 10.5 K/uL 8.9 8.2  Hemoglobin 12.0 - 15.0 g/dL 12.9 13.6  Hematocrit 36.0 - 46.0 % 37.5 40.3  Platelets 150 - 400 K/uL 199 186   Lipid Panel     Component Value Date/Time   CHOL 144 01/28/2020 0957   TRIG 54 01/28/2020 0957   HDL 70 01/28/2020 0957   LDLCALC 62 01/28/2020 0957   HEMOGLOBIN A1C No results found for: HGBA1C, MPG TSH No results for input(s): TSH in the last 8760 hours.  External labs:  08/12/2020: Sodium 141, potassium 5.0, BUN 24, glucose 111, creatinine 0.83, alk phos 37, AST 20, ALT 26, GFR 76  06/19/2019: Sodium 139, potassium 4.0, BUN 23, creatinine 0.68, EGFR >60 mL, serum glucose 95 mg. TSH normal. Hb 13.1/HCT 38.2, platelets 213.  10/05/2018:  BUN 19, creatinine 0.65, eGFR greater than 60 mL.  CMP otherwise normal.  Serum calcium was minimally elevated for the 1st time at 10.7.  D-dimer normal.  04/17/2018: Cholesterol 140, triglycerides 60, HDL 63, LDL 65. Creatinine 0.61, eGFR 93/108, Potassium 4.6, CMP normal. Allergies   Allergies  Allergen Reactions  . Crestor [Rosuvastatin]     Memory loss       Medications Prior to Visit:   Outpatient Medications Prior to Visit  Medication Sig Dispense Refill  . amLODipine (NORVASC) 5 MG tablet TAKE 1 TABLET DAILY 90 tablet 3  . aspirin 81 MG tablet Take 81 mg by mouth daily.    Marland Kitchen ezetimibe-simvastatin (VYTORIN) 10-20 MG tablet Take 1 tablet by mouth daily at 6 PM. 30 tablet 6  . hydrochlorothiazide (MICROZIDE) 12.5 MG capsule Take 12.5 mg by mouth as needed.    . solifenacin (VESICARE) 5  MG tablet Take 2 tablets by mouth daily.    Marland Kitchen spironolactone (ALDACTONE) 25 MG tablet TAKE 1 TABLET EVERY MORNING 90 tablet 3  . albuterol (VENTOLIN HFA) 108 (90 Base) MCG/ACT inhaler Inhale into the lungs.    . predniSONE (DELTASONE) 20 MG tablet Take 3 tabs PO daily x 5 days. 15 tablet 0   No facility-administered medications prior to visit.     Final Medications at End of Visit    Current Meds  Medication Sig  . amLODipine (NORVASC) 5 MG tablet TAKE 1 TABLET DAILY  .  aspirin 81 MG tablet Take 81 mg by mouth daily.  Marland Kitchen ezetimibe-simvastatin (VYTORIN) 10-20 MG tablet Take 1 tablet by mouth daily at 6 PM.  . hydrochlorothiazide (MICROZIDE) 12.5 MG capsule Take 12.5 mg by mouth as needed.  . solifenacin (VESICARE) 5 MG tablet Take 2 tablets by mouth daily.  Marland Kitchen spironolactone (ALDACTONE) 25 MG tablet TAKE 1 TABLET EVERY MORNING   Cardiac Studies:   Echocardiogram 04/03/2019:  Left ventricle cavity is normal in size. Moderate concentric hypertrophy  of the left ventricle. Normal LV systolic function with EF 55%. Normal  global wall motion. Doppler evidence of grade I (impaired) diastolic  dysfunction, normal LAP.  Left atrial cavity is mildly dilated.  Mild (Grade I) mitral regurgitation.  Mild tricuspid regurgitation.  No evidence of pulmonary hypertension.  No significant change compared to previous study on 01/15/2018.   Lexiscan sestamibi stress test 11/09/2013:  1. The resting electrocardiogram demonstrated normal sinus rhythm, no resting arrhythmias and nonspecific ST-T changes. Cannot r/o inferior and antererolateral ischemia. Stress EKG is nondiagnostic for ischemia as it low pharmacologic stress test. Stress symptoms included shortness of breath. 2. Myocardial perfusion imaging is normal. Overall left ventricular systolic function was normal without regional wall motion abnormalities. The left ventricular ejection fraction was calculated or visually estimated to be 58%.  Carotid  artery duplex 04/15/2020:  Stenosis in the right internal carotid artery (50-69%). Stenosis in the  right external carotid artery (<50%).  Stenosis in the left internal carotid artery (16-49%). Stenosis in the  left external carotid artery (<50%).  Antegrade right vertebral artery flow. Left vertebral artery flow is not  visualized.  Follow up in six months is appropriate if clinically indicated. Compared  to 10/21/2019, no significant change.   EKG   10/03/2020: Sinus rhythm at a rate of 60 bpm.  Normal axis.  LVH with secondary repolarization abnormality, cannot exclude ischemia.  Q waves, cannot exclude lateral infarct old. Compared to EKG 10/21/2019 no significant change.   10/21/2019: Sinus bradycardia at rate of 55 bpm, normal axis, LVH with repolarization abnormality, cannot exclude inferior and anterolateral ischemia.  Q in aVL and I, cannot exclude high lateral inferct old. Early R transition, consider RVH.  Consider to posterior infarct. No significant change from 10/05/2018.  Assessment     ICD-10-CM   1. Asymptomatic bilateral carotid artery stenosis  I65.23 EKG 12-Lead  2. Hypercholesteremia  E78.00   3. Essential hypertension  I10 EKG 12-Lead  4. Dyspnea on exertion  R06.00 PCV ECHOCARDIOGRAM COMPLETE    PCV MYOCARDIAL PERFUSION WO LEXISCAN    No orders of the defined types were placed in this encounter.  Medications Discontinued During This Encounter  Medication Reason  . albuterol (VENTOLIN HFA) 108 (90 Base) MCG/ACT inhaler Error  . predniSONE (DELTASONE) 20 MG tablet Error    Recommendations:   Amanda Dean  is a 70 y.o.  with history of stroke, S/P ASD repair with 28-mm Starflex septal occluder closure of the ASD 05/05/2008, history of tobacco use disorder with COPD and quit smoking in 2015, hypertension, asymptomatic carotid artery stenosis and hyperlipidemia.   She presents for annual visit and follow-up of carotid artery stenosis, hypertension, hyperlipidemia.  Given patient's symptoms of decreased exercise tolerance, dyspnea on exertion, and increased bilateral ankle swelling would recommend further evaluation including nuclear stress test and echocardiogram. Patient's EKG unchanged compared to previous in the office today, however cannot exclude ischemia. Therefore although patient's symptoms of chest discomfort are atypical, cannot ruling our underlying ischemia particularly  given dyspnea on exertion. Will obtain stress test to evaluate for underlying ischemia and echocardiogram to evaluate LV function.   Counseled patient regarding signs and symptoms that would warrant urgent/emergent evaluation, she verbalized understanding and agreement.   Given patient's concern regarding hair loss related to statin therapy, will discontinue simvastatin and switch patient to pravastatin 20 mg nightly along with Zetia 10 mg.  Patient has upcoming carotid duplex and follow up with Dr. Einar Gip. Patient prefers to keep this appointment and follow up with Dr. Einar Gip for further management.    Alethia Berthold, PA-C 10/03/2020, 4:55 PM Office: [phone]

## 2020-09-29 NOTE — Discharge Instructions (Addendum)
Advised patient to take medication as directed with food to completion.  Instructed patient not to start oral prednisone burst until tomorrow morning Friday, 09/30/2020.  Encourage patient to increase daily water intake while taking this medication.

## 2020-09-29 NOTE — ED Triage Notes (Signed)
DOE for a few weeks  Has an appointment w/ cards on 10/03/20 6/28 Carotid doppler study  Rash to left FA today after sitting outside today  Pt has noticed edema to BLE throughout the day  Pt has compression socks - uncomfortable to wear

## 2020-09-29 NOTE — ED Notes (Signed)
Instructed Amanda Dean to ask her cardiologist or PCP about being fitted for compression socks that were specifically fitted to her legs. Pt stated her current pair are too tight and uncomfortable and difficult to put on. Amanda Dean verbalized an understanding

## 2020-09-29 NOTE — ED Provider Notes (Signed)
Amanda Dean [facility]    CSN: [id number] Arrival date & time: 09/29/20  1800      History   Chief Complaint Chief Complaint  Patient presents with  . Shortness of Breath  . Rash    R FA    HPI Amanda Dean is a 70 y.o. female.   HPI 70 year old female presents with rash to left forearm today while sitting outside, reports dyspnea with mild exertion for the past 2 to 3 weeks, and noticed bilateral lower extremity edema throughout the day.  Patient is currently on spironolactone 25 mg every morning.  Patient is scheduled to follow-up with her cardiologist on Monday, 10/03/2020 and is scheduled for carotid Doppler study on 10/25/2020.Marland Kitchen  PMH significant for CVA, ASD, COPD, and LVH. Patient as been prescribed HCTZ 12.5 for pedal edema by PCP on 08/12/2020.  Past Medical History:  Diagnosis Date  . ASD (atrial septal defect)    PFO no significant shunt  . COPD (chronic obstructive pulmonary disease) (HCC)   . CVA (cerebral infarction)    Lt MCA , 2001 failed cerebral angioplasty x 2  . Hyperlipidemia   . Hypoxemia   . LVH (left ventricular hypertrophy)   . Tobacco abuse     Patient Active Problem List   Diagnosis Date Noted  . Status post device closure of ASD 11/23/2012  . HYPERLIPIDEMIA 11/27/2007  . TOBACCO ABUSE 11/27/2007  . CVA 11/27/2007  . C O P D 11/27/2007  . HYPOXEMIA HYPOXIA HYPOXIC 11/27/2007    Past Surgical History:  Procedure Laterality Date  . ASD REPAIR  05/05/2008   Dr. Michell Heinrich 64mm device  . CARDIAC CATHETERIZATION  12/11/2007   10-20% LUMINAL IRREGULARITY MID LAD, otherwise normal coronary arteries  . Cerebral angioplasty    . NM MYOVIEW LTD  01/12/2011   no ischemia  . US ECHOCARDIOGRAPHY  10/25/2011   mod. concentric  LVH,EF 60-65%,trace MR    OB History   No obstetric history on file.      Home Medications    Prior to Admission medications   Medication Sig Start Date End Date Taking? Authorizing Provider  amLODipine (NORVASC) 5  MG tablet TAKE 1 TABLET DAILY 01/25/20  Yes Yates Decamp, MD  aspirin 81 MG tablet Take 81 mg by mouth daily.   Yes [provider]  ezetimibe-simvastatin (VYTORIN) 10-20 MG tablet Take 1 tablet by mouth daily at 6 PM. 04/01/20  Yes Yates Decamp, MD  hydrochlorothiazide (MICROZIDE) 12.5 MG capsule Take by mouth. 08/12/20 11/10/20 Yes [provider]  predniSONE (DELTASONE) 20 MG tablet Take 3 tabs PO daily x 5 days. 09/29/20  Yes Trevor Iha, FNP  solifenacin (VESICARE) 5 MG tablet Take 2 tablets by mouth daily. 04/18/20  Yes [provider]  spironolactone (ALDACTONE) 25 MG tablet TAKE 1 TABLET EVERY MORNING 01/25/20  Yes Yates Decamp, MD  albuterol (VENTOLIN HFA) 108 (90 Base) MCG/ACT inhaler Inhale into the lungs. 10/05/18 10/05/19  [provider]    Family History Family History  Problem Relation Age of Onset  . Rheumatic fever Mother   . Prostate cancer Father     Social History Social History   Tobacco Use  . Smoking status: Former Smoker    Packs/day: 1.00    Years: 35.00    Pack years: 35.00    Types: Cigarettes    Quit date: 06/17/2013    Years since quitting: 7.2  . Smokeless tobacco: Never Used  Vaping Use  . Vaping Use: Never used  Substance Use Topics  . Alcohol use: Yes    Alcohol/week: 6.0 standard drinks    Types: 3 Glasses of wine, 3 Cans of beer per week    Comment: rarely  . Drug use: No     Allergies   Patient has no known allergies.   Review of Systems Review of Systems  Constitutional: Negative.   HENT: Negative.   Respiratory: Positive for shortness of breath.   Cardiovascular: Negative.   Gastrointestinal: Negative.   Genitourinary: Negative.   Musculoskeletal: Negative.   Skin: Positive for rash.  Neurological: Negative.      Physical Exam Triage Vital Signs ED Triage Vitals  Enc Vitals Group     BP 09/29/20 1819 136/77     Pulse Rate 09/29/20 1819 87     Resp 09/29/20 1819 17     Temp 09/29/20 1819 99.3  F (37.4 C)     Temp Source 09/29/20 1819 Oral     SpO2 09/29/20 1819 96 %     Weight 09/29/20 1820 187 lb (84.8 kg)     Height 09/29/20 1820 5\' 2"  (1.575 m)     Head Circumference --      Peak Flow --      Pain Score 09/29/20 1820 2     Pain Loc --      Pain Edu? --      Excl. in GC? --    No data found.  Updated Vital Signs BP 136/77 (BP Location: Right Arm)   Pulse 87   Temp 99.3 F (37.4 C) (Oral)   Resp 17   Ht 5\' 2"  (1.575 m)   Wt 187 lb (84.8 kg)   SpO2 96%   BMI 34.20 kg/m      Physical Exam Constitutional:      General: She is not in acute distress.    Appearance: She is obese. She is ill-appearing. She is not toxic-appearing or diaphoretic.  HENT:     Head: Normocephalic and atraumatic.     Mouth/Throat:     Mouth: Mucous membranes are moist.     Pharynx: Oropharynx is clear. No pharyngeal swelling or oropharyngeal exudate.  Eyes:     Extraocular Movements: Extraocular movements intact.     Pupils: Pupils are equal, round, and reactive to light.  Neck:     Vascular: No JVD.     Trachea: No tracheal deviation.     Comments: No bruit Cardiovascular:     Rate and Rhythm: Normal rate and regular rhythm.     Pulses: Normal pulses.     Heart sounds: Normal heart sounds.     Comments: Lower extremity bilaterally: No edema PT/DP+1 bounding Pulmonary:     Effort: Pulmonary effort is normal. No tachypnea, bradypnea, accessory muscle usage or respiratory distress.     Breath sounds: Examination of the right-lower field reveals decreased breath sounds. Examination of the left-lower field reveals decreased breath sounds. Decreased breath sounds present. No wheezing, rhonchi or rales.  Chest:     Chest wall: No tenderness or edema.  Musculoskeletal:        General: Normal range of motion.     Cervical back: Normal range of motion and neck supple.     Right lower leg: No tenderness. No edema.     Left lower leg: No edema.  Lymphadenopathy:     Cervical: No  cervical adenopathy.  Skin:    General: Skin is warm and dry.     Coloration: Skin is  not cyanotic.     Findings: Rash present.     Comments: Left lower arm (dorsal distal aspect): Diffuse scattered maculopapular eruption, pruritic in nature  Neurological:     General: No focal deficit present.     Mental Status: She is alert and oriented to person, place, and time.  Psychiatric:        Mood and Affect: Mood normal.        Behavior: Behavior normal.      UC Treatments / Results  Labs (all labs ordered are listed, but only abnormal results are displayed) Labs Reviewed - No data to display  EKG   Radiology No results found.  Procedures Procedures (including critical care time)  Medications Ordered in UC Medications  methylPREDNISolone acetate (DEPO-MEDROL) injection 80 mg (80 mg Intramuscular Given 09/29/20 1840)    Initial Impression / Assessment and Plan / UC Course  I have reviewed the triage vital signs and the nursing notes.  Pertinent labs & imaging results that were available during my care of the patient were reviewed by me and considered in my medical decision making (see chart for details).     1.  Rash and nonspecific skin eruption, 2.  Shortness of breath.  Patient discharged home, hemodynamically stable. Final Clinical Impressions(s) / UC Diagnoses   Final diagnoses:  Rash and nonspecific skin eruption  Shortness of breath     Discharge Instructions     Advised patient to take medication as directed with food to completion.  Instructed patient not to start oral prednisone burst until tomorrow morning Friday, 09/30/2020.  Encourage patient to increase daily water intake while taking this medication.    ED Prescriptions    Medication Sig Dispense Auth. Provider   predniSONE (DELTASONE) 20 MG tablet Take 3 tabs PO daily x 5 days. 15 tablet Trevor Iha, FNP     PDMP not reviewed this encounter.   Trevor Iha, FNP 09/29/20 [phone]

## 2020-10-03 ENCOUNTER — Other Ambulatory Visit: Payer: Self-pay

## 2020-10-03 ENCOUNTER — Encounter: Payer: Self-pay | Admitting: Student

## 2020-10-03 ENCOUNTER — Ambulatory Visit: Payer: Medicare Other | Admitting: Student

## 2020-10-03 VITALS — BP 128/72 | HR 71 | Temp 98.2°F | Ht 62.0 in | Wt 189.4 lb

## 2020-10-03 DIAGNOSIS — R0600 Dyspnea, unspecified: Secondary | ICD-10-CM

## 2020-10-03 DIAGNOSIS — I10 Essential (primary) hypertension: Secondary | ICD-10-CM

## 2020-10-03 DIAGNOSIS — I6523 Occlusion and stenosis of bilateral carotid arteries: Secondary | ICD-10-CM

## 2020-10-03 DIAGNOSIS — E7800 Pure hypercholesterolemia, unspecified: Secondary | ICD-10-CM

## 2020-10-03 DIAGNOSIS — R0609 Other forms of dyspnea: Secondary | ICD-10-CM

## 2020-10-04 MED ORDER — PRAVASTATIN SODIUM 20 MG PO TABS: 20.0000 mg | ORAL_TABLET | Freq: Every evening | ORAL | 3 refills | Status: DC

## 2020-10-04 MED ORDER — EZETIMIBE 10 MG PO TABS: 10.0000 mg | ORAL_TABLET | Freq: Every day | ORAL | 3 refills | Status: DC

## 2020-10-20 ENCOUNTER — Ambulatory Visit: Payer: Medicare Other

## 2020-10-20 ENCOUNTER — Encounter: Payer: Self-pay | Admitting: Cardiology

## 2020-10-20 ENCOUNTER — Other Ambulatory Visit: Payer: Self-pay

## 2020-10-20 ENCOUNTER — Ambulatory Visit: Payer: Medicare Other | Admitting: Cardiology

## 2020-10-20 ENCOUNTER — Other Ambulatory Visit: Payer: Medicare Other

## 2020-10-20 VITALS — BP 127/51 | HR 71 | Temp 98.6°F | Resp 17 | Ht 62.0 in | Wt 188.0 lb

## 2020-10-20 DIAGNOSIS — I6523 Occlusion and stenosis of bilateral carotid arteries: Secondary | ICD-10-CM

## 2020-10-20 DIAGNOSIS — R0600 Dyspnea, unspecified: Secondary | ICD-10-CM

## 2020-10-20 DIAGNOSIS — R0609 Other forms of dyspnea: Secondary | ICD-10-CM

## 2020-10-20 DIAGNOSIS — I10 Essential (primary) hypertension: Secondary | ICD-10-CM

## 2020-10-20 DIAGNOSIS — E7800 Pure hypercholesterolemia, unspecified: Secondary | ICD-10-CM

## 2020-10-20 NOTE — Progress Notes (Signed)
Primary Physician/Referring:  [name], NP  Patient ID: Amanda Dean, female    DOB: Feb 26, 1951, 70 y.o.   MRN: [id number]  No chief complaint on file.   HPI: Amanda Dean  is a 70 y.o. female  with history of stroke, S/P ASD repair with 28-mm Starflex septal occluder closure of the ASD 05/05/2008, history of tobacco use disorder with COPD and quit smoking in 2015, hypertension, asymptomatic carotid artery stenosis and hyperlipidemia.   She was seen by Ms. Cantwell, PA on 10/03/2020 for atypical chest pain and was recommended a echocardiogram and also nuclear stress test.  She has not had any further episodes of chest pain, underwent carotid artery duplex this morning along with an echocardiogram and presents for follow-up.  She has resumed her activities without any limitations, has chronic mild dyspnea.  Obesity continues to be her main issue and she would like to cancel the stress test.  On her last office visit simvastatin was discontinued due to hair loss and switch to pravastatin and Zetia which she is tolerating without any side effects.  Past Medical History:  Diagnosis Date   ASD (atrial septal defect)    PFO no significant shunt   COPD (chronic obstructive pulmonary disease) (HCC)    CVA (cerebral infarction)    Lt MCA , 2001 failed cerebral angioplasty x 2   Hyperlipidemia    Hypoxemia    LVH (left ventricular hypertrophy)    Tobacco abuse     Past Surgical History:  Procedure Laterality Date   ASD REPAIR  05/05/2008   Dr. Ophelia Shoulder 35m device   CARDIAC CATHETERIZATION  12/11/2007   10-20% LUMINAL IRREGULARITY MID LAD, otherwise normal coronary arteries   Cerebral angioplasty     NM MYOVIEW LTD  01/12/2011   no ischemia   [place]  10/25/2011   mod. concentric  LVH,EF 60-65%,trace MR   Family History  Problem Relation Age of Onset   Rheumatic fever Mother    Prostate cancer Father     Social History   Tobacco Use   Smoking status: Former     Packs/day: 1.00    Years: 35.00    Pack years: 35.00    Types: Cigarettes    Quit date: 06/17/2013    Years since quitting: 7.3   Smokeless tobacco: Never  Substance Use Topics   Alcohol use: Yes    Alcohol/week: 6.0 standard drinks    Types: 3 Glasses of wine, 3 Cans of beer per week    Comment: rarely   Marital Status: Single   ROS   Review of Systems  Constitutional: Negative for malaise/fatigue and weight gain.  Cardiovascular:  Positive for chest pain and dyspnea on exertion. Negative for claudication, leg swelling, near-syncope, orthopnea, palpitations, paroxysmal nocturnal dyspnea and syncope.  Respiratory:  Negative for shortness of breath.   Hematologic/Lymphatic: Does not bruise/bleed easily.  Musculoskeletal:  Positive for joint pain and neck pain.  Gastrointestinal:  Negative for melena.  Neurological:  Negative for dizziness and weakness.  Objective  Blood pressure (!) 127/51, pulse 71, temperature 98.6 F (37 C), temperature source Temporal, resp. rate 17, height 5' 2"  (1.575 m), weight 188 lb (85.3 kg), SpO2 95 %. Body mass index is 34.39 kg/m. Vitals with BMI 10/20/2020 10/20/2020 10/03/2020  Height - 5' 2"  5' 2"   Weight - 188 lbs 189 lbs 6 oz  BMI - 318.29393.71 Systolic [id number] Diastolic 51 62 72  Pulse 71 58 71  Physical Exam Constitutional:      General: She is not in acute distress.    Appearance: She is well-developed.     Comments: Mildly obese  Neck:     Thyroid: No thyromegaly.     Vascular: No JVD.  Cardiovascular:     Rate and Rhythm: Normal rate and regular rhythm.     Pulses: Intact distal pulses.          Carotid pulses are  on the left side with bruit.      Dorsalis pedis pulses are 1+ on the right side and 1+ on the left side.       Posterior tibial pulses are 2+ on the right side and 2+ on the left side.     Heart sounds: S1 normal and S2 normal. Murmur heard.  Early systolic murmur is present with a grade of 2/6 at the upper  right sternal border.    No gallop.  Pulmonary:     Effort: Pulmonary effort is normal.     Breath sounds: No rales.  Musculoskeletal:     Cervical back: Neck supple.    Laboratory examination:   No flowsheet data found. CBC Latest Ref Rng & Units 05/06/2008 05/05/2008  WBC 4.0 - 10.5 K/uL 8.9 8.2  Hemoglobin 12.0 - 15.0 g/dL 12.9 13.6  Hematocrit 36.0 - 46.0 % 37.5 40.3  Platelets 150 - 400 K/uL 199 186   Lipid Panel     Component Value Date/Time   CHOL 144 01/28/2020 0957   TRIG 54 01/28/2020 0957   HDL 70 01/28/2020 0957   LDLCALC 62 01/28/2020 0957   HEMOGLOBIN A1C No results found for: HGBA1C, MPG TSH No results for input(s): TSH in the last 8760 hours.  External labs:  08/12/2020: Sodium 141, potassium 5.0, BUN 24, glucose 111, creatinine 0.83, alk phos 37, AST 20, ALT 26, GFR 76  06/19/2019: Sodium 139, potassium 4.0, BUN 23, creatinine 0.68, EGFR >60 mL, serum glucose 95 mg. TSH normal. Hb 13.1/HCT 38.2, platelets 213.  10/05/2018:  BUN 19, creatinine 0.65, eGFR greater than 60 mL.  CMP otherwise normal.  Serum calcium was minimally elevated for the 1st time at 10.7.  D-dimer normal.  04/17/2018: Cholesterol 140, triglycerides 60, HDL 63, LDL 65. Creatinine 0.61, eGFR 93/108, Potassium 4.6, CMP normal. Allergies   Allergies  Allergen Reactions   Crestor [Rosuvastatin]     Memory loss       Medications Prior to Visit:   Outpatient Medications Prior to Visit  Medication Sig Dispense Refill   amLODipine (NORVASC) 5 MG tablet TAKE 1 TABLET DAILY 90 tablet 3   aspirin 81 MG tablet Take 81 mg by mouth daily.     ezetimibe (ZETIA) 10 MG tablet Take 1 tablet (10 mg total) by mouth daily. 90 tablet 3   hydrochlorothiazide (MICROZIDE) 12.5 MG capsule Take 12.5 mg by mouth as needed.     pravastatin (PRAVACHOL) 20 MG tablet Take 1 tablet (20 mg total) by mouth every evening. 30 tablet 3   solifenacin (VESICARE) 5 MG tablet Take 2 tablets by mouth daily.      spironolactone (ALDACTONE) 25 MG tablet TAKE 1 TABLET EVERY MORNING 90 tablet 3   No facility-administered medications prior to visit.   Final Medications at End of Visit    Current Meds  Medication Sig   amLODipine (NORVASC) 5 MG tablet TAKE 1 TABLET DAILY   aspirin 81 MG tablet Take 81 mg by mouth daily.   ezetimibe (ZETIA)  10 MG tablet Take 1 tablet (10 mg total) by mouth daily.   hydrochlorothiazide (MICROZIDE) 12.5 MG capsule Take 12.5 mg by mouth as needed.   pravastatin (PRAVACHOL) 20 MG tablet Take 1 tablet (20 mg total) by mouth every evening.   solifenacin (VESICARE) 5 MG tablet Take 2 tablets by mouth daily.   spironolactone (ALDACTONE) 25 MG tablet TAKE 1 TABLET EVERY MORNING   Cardiac Studies:   Lexiscan sestamibi stress test 11/09/2013:  1. The resting electrocardiogram demonstrated normal sinus rhythm, no resting arrhythmias and nonspecific ST-T changes. Cannot r/o inferior and antererolateral ischemia. Stress EKG is nondiagnostic for ischemia as it low pharmacologic stress test. Stress symptoms included shortness of breath. 2. Myocardial perfusion imaging is normal. Overall left ventricular systolic function was normal without regional wall motion abnormalities. The left ventricular ejection fraction was calculated or visually estimated to be 58%.  Echocardiogram 10/20/2020: Normal LV systolic function with EF 55%. Left ventricle cavity is normal in size. Moderate concentric hypertrophy of the left ventricle. Normal global wall motion. Doppler evidence of grade II (pseudonormal) diastolic dysfunction, elevated LAP. Calculated EF 55%. Left atrial cavity is moderately dilated by volume. Structurally normal mitral valve.  Mild (Grade I) mitral regurgitation. Structurally normal tricuspid valve.  Mild tricuspid regurgitation. No evidence of pulmonary hypertension. Compared to 86/76/1951, grade I diastolic dysfunction is now grade II.  Carotid artery duplex 10/20/2020: Duplex  suggests stenosis in the right internal carotid artery (50-69%). Duplex suggests stenosis in the right external carotid artery (<50%). Duplex suggests stenosis in the left internal carotid artery (50-69%). Duplex suggests stenosis in the left external carotid artery (<50%). Antegrade right vertebral artery flow. Antegrade left vertebral artery flow. No significant change from 04/15/2020, left ICA stenosis is minimally progressed. Follow up in six months is appropriate if clinically indicated.  EKG   10/03/2020: Sinus rhythm at a rate of 60 bpm.  Normal axis.  LVH with secondary repolarization abnormality, cannot exclude ischemia.  Q waves, cannot exclude lateral infarct old. Compared to EKG 10/21/2019 no significant change.   10/21/2019: Sinus bradycardia at rate of 55 bpm, normal axis, LVH with repolarization abnormality, cannot exclude inferior and anterolateral ischemia.  Q in aVL and I, cannot exclude high lateral inferct old. Early R transition, consider RVH.  Consider to posterior infarct. No significant change from 10/05/2018.  Assessment     ICD-10-CM   1. Asymptomatic bilateral carotid artery stenosis  I65.23 PCV CAROTID DUPLEX (BILATERAL)    2. Hypercholesteremia  E78.00     3. Essential hypertension  I10     4. Dyspnea on exertion  R06.00      No orders of the defined types were placed in this encounter.  There are no discontinued medications.  Recommendations:   Amanda Dean  is a 70 y.o.  with history of stroke, S/P ASD repair with 28-mm Starflex septal occluder closure of the ASD 05/05/2008, history of tobacco use disorder with COPD and quit smoking in 2015, hypertension, asymptomatic carotid artery stenosis and hyperlipidemia.   She was seen by Ms. Cantwell, PA on 10/03/2020 for atypical chest pain and was recommended a echocardiogram and also nuclear stress test.  Today I reviewed the results of the carotid artery duplex and also echocardiogram, no significant change in  carotid stenosis and echocardiogram is essentially unremarkable and no change from previous with preserved LVEF.  She has not had any further episodes of chest pain, hypertension is well controlled, lipids are also under excellent control.  She would like  to cancel the stress test for now.  If she has recurrence of chest pain we could consider repeating the stress test.  Otherwise she is stable from cardiac standpoint, except for repeating carotid artery surveillance in 6 months, I will see her back on an annual basis.  Given patient's concern regarding hair loss related to statin therapy, simvastatin was discontinued on her previous office visit and switched patient to pravastatin 20 mg nightly along with Zetia 10 mg.  She is tolerating this without side effects.  She will eventually need lipid profile testing which she will perform with her PCP.    Adrian Prows, PA-C 10/20/2020, 12:05 PM Office: [phone]

## 2020-10-20 NOTE — Progress Notes (Deleted)
Primary Physician/Referring:  [name], NP  Patient ID: Amanda Dean, female    DOB: [birth year], 70 y.o.   MRN: [id number]  No chief complaint on file.   HPI: Amanda Dean  is a 70 y.o. female  with history of stroke, S/P ASD repair with 28-mm Starflex septal occluder closure of the ASD 05/05/2008, history of tobacco use disorder (quit in 2015) with COPD and quit smoking in 2015, hypertension, asymptomatic carotid artery stenosis and hyperlipidemia. Patient also has history of anxiety.   She presents for annual visit and follow-up of carotid artery stenosis, hypertension, hyperlipidemia.  Patient has concerns of dyspnea on exertion and reduced exercise tolerance over the last 1 month.  She has also noticed increased bilateral ankle swelling during the same period of time.  She notes he has had an injury in the past to the left ankle, however now her right ankle swelling.  Patient states bilateral ankle swelling worsens throughout the day and improves with elevation.  She also complains of intermittent chest discomfort which she describes as heaviness lasting approximately 30 seconds without identifiable triggers and not associated with exertion.   She is also concerned that she feels her statin medication is causing significant hair loss.  Past Medical History:  Diagnosis Date   ASD (atrial septal defect)    PFO no significant shunt   COPD (chronic obstructive pulmonary disease) (HCC)    CVA (cerebral infarction)    Lt MCA , 2001 failed cerebral angioplasty x 2   Hyperlipidemia    Hypoxemia    LVH (left ventricular hypertrophy)    Tobacco abuse     Past Surgical History:  Procedure Laterality Date   ASD REPAIR  05/05/2008   Dr. Ophelia Shoulder 65m device   CARDIAC CATHETERIZATION  12/11/2007   10-20% LUMINAL IRREGULARITY MID LAD, otherwise normal coronary arteries   Cerebral angioplasty     NM MYOVIEW LTD  01/12/2011   no ischemia   [place]  10/25/2011   mod. concentric   LVH,EF 60-65%,trace MR   Family History  Problem Relation Age of Onset   Rheumatic fever Mother    Prostate cancer Father     Social History   Tobacco Use   Smoking status: Former    Packs/day: 1.00    Years: 35.00    Pack years: 35.00    Types: Cigarettes    Quit date: 06/17/2013    Years since quitting: 7.3   Smokeless tobacco: Never  Substance Use Topics   Alcohol use: Yes    Alcohol/week: 6.0 standard drinks    Types: 3 Glasses of wine, 3 Cans of beer per week    Comment: rarely   Marital Status: Single   ROS   Review of Systems  Constitutional: Negative for malaise/fatigue and weight gain.  Cardiovascular:  Positive for chest pain and dyspnea on exertion. Negative for claudication, leg swelling, near-syncope, orthopnea, palpitations, paroxysmal nocturnal dyspnea and syncope.  Respiratory:  Negative for shortness of breath.   Hematologic/Lymphatic: Does not bruise/bleed easily.  Musculoskeletal:  Positive for joint pain and neck pain.  Gastrointestinal:  Negative for melena.  Neurological:  Negative for dizziness and weakness.  Objective  Blood pressure (!) 127/51, pulse 71, temperature 98.6 F (37 C), temperature source Temporal, resp. rate 17, height _0  (1.575 m), weight 188 lb (85.3 kg), SpO2 95 %. Body mass index is 34.39 kg/m.    Physical Exam Constitutional:      General: She is not in acute distress.  Appearance: She is well-developed.     Comments: Mildly obese  Neck:     Thyroid: No thyromegaly.     Vascular: No JVD.  Cardiovascular:     Rate and Rhythm: Normal rate and regular rhythm.     Pulses: Intact distal pulses.          Carotid pulses are  on the left side with bruit.      Dorsalis pedis pulses are 1+ on the right side and 1+ on the left side.       Posterior tibial pulses are 2+ on the right side and 2+ on the left side.     Heart sounds: S1 normal and S2 normal. Murmur heard.  Early systolic murmur is present with a grade of 2/6 at  the upper right sternal border.    No gallop.  Pulmonary:     Effort: Pulmonary effort is normal.     Breath sounds: No rales.  Musculoskeletal:     Cervical back: Neck supple.    Laboratory examination:   No flowsheet data found. CBC Latest Ref Rng & Units 05/06/2008 05/05/2008  WBC 4.0 - 10.5 K/uL 8.9 8.2  Hemoglobin 12.0 - 15.0 g/dL 12.9 13.6  Hematocrit 36.0 - 46.0 % 37.5 40.3  Platelets 150 - 400 K/uL 199 186   Lipid Panel     Component Value Date/Time   CHOL 144 01/28/2020 0957   TRIG 54 01/28/2020 0957   HDL 70 01/28/2020 0957   LDLCALC 62 01/28/2020 0957   HEMOGLOBIN A1C No results found for: HGBA1C, MPG TSH No results for input(s): TSH in the last 8760 hours.  External labs:  08/12/2020: Sodium 141, potassium 5.0, BUN 24, glucose 111, creatinine 0.83, alk phos 37, AST 20, ALT 26, GFR 76  06/19/2019: Sodium 139, potassium 4.0, BUN 23, creatinine 0.68, EGFR >60 mL, serum glucose 95 mg. TSH normal. Hb 13.1/HCT 38.2, platelets 213.  10/05/2018:  BUN 19, creatinine 0.65, eGFR greater than 60 mL.  CMP otherwise normal.  Serum calcium was minimally elevated for the 1st time at 10.7.  D-dimer normal.  04/17/2018: Cholesterol 140, triglycerides 60, HDL 63, LDL 65. Creatinine 0.61, eGFR 93/108, Potassium 4.6, CMP normal. Allergies   Allergies  Allergen Reactions   Crestor [Rosuvastatin]     Memory loss       Medications Prior to Visit:   Outpatient Medications Prior to Visit  Medication Sig Dispense Refill   amLODipine (NORVASC) 5 MG tablet TAKE 1 TABLET DAILY 90 tablet 3   aspirin 81 MG tablet Take 81 mg by mouth daily.     ezetimibe (ZETIA) 10 MG tablet Take 1 tablet (10 mg total) by mouth daily. 90 tablet 3   hydrochlorothiazide (MICROZIDE) 12.5 MG capsule Take 12.5 mg by mouth as needed.     pravastatin (PRAVACHOL) 20 MG tablet Take 1 tablet (20 mg total) by mouth every evening. 30 tablet 3   solifenacin (VESICARE) 5 MG tablet Take 2 tablets by mouth daily.      spironolactone (ALDACTONE) 25 MG tablet TAKE 1 TABLET EVERY MORNING 90 tablet 3   No facility-administered medications prior to visit.     Final Medications at End of Visit    Current Meds  Medication Sig   amLODipine (NORVASC) 5 MG tablet TAKE 1 TABLET DAILY   aspirin 81 MG tablet Take 81 mg by mouth daily.   ezetimibe (ZETIA) 10 MG tablet Take 1 tablet (10 mg total) by mouth daily.   hydrochlorothiazide (MICROZIDE)  12.5 MG capsule Take 12.5 mg by mouth as needed.   pravastatin (PRAVACHOL) 20 MG tablet Take 1 tablet (20 mg total) by mouth every evening.   solifenacin (VESICARE) 5 MG tablet Take 2 tablets by mouth daily.   spironolactone (ALDACTONE) 25 MG tablet TAKE 1 TABLET EVERY MORNING   Cardiac Studies:   Echocardiogram 04/03/2019:  Left ventricle cavity is normal in size. Moderate concentric hypertrophy  of the left ventricle. Normal LV systolic function with EF 55%. Normal  global wall motion. Doppler evidence of grade I (impaired) diastolic  dysfunction, normal LAP.  Left atrial cavity is mildly dilated.  Mild (Grade I) mitral regurgitation.  Mild tricuspid regurgitation.  No evidence of pulmonary hypertension.  No significant change compared to previous study on 01/15/2018.   Lexiscan sestamibi stress test 11/09/2013:  1. The resting electrocardiogram demonstrated normal sinus rhythm, no resting arrhythmias and nonspecific ST-T changes. Cannot r/o inferior and antererolateral ischemia. Stress EKG is nondiagnostic for ischemia as it low pharmacologic stress test. Stress symptoms included shortness of breath. 2. Myocardial perfusion imaging is normal. Overall left ventricular systolic function was normal without regional wall motion abnormalities. The left ventricular ejection fraction was calculated or visually estimated to be 58%.  Carotid artery duplex 04/15/2020:  Stenosis in the right internal carotid artery (50-69%). Stenosis in the  right external carotid artery  (<50%).  Stenosis in the left internal carotid artery (16-49%). Stenosis in the  left external carotid artery (<50%).  Antegrade right vertebral artery flow. Left vertebral artery flow is not  visualized.  Follow up in six months is appropriate if clinically indicated. Compared  to 10/21/2019, no significant change.   EKG   10/03/2020: Sinus rhythm at a rate of 60 bpm.  Normal axis.  LVH with secondary repolarization abnormality, cannot exclude ischemia.  Q waves, cannot exclude lateral infarct old. Compared to EKG 10/21/2019 no significant change.   10/21/2019: Sinus bradycardia at rate of 55 bpm, normal axis, LVH with repolarization abnormality, cannot exclude inferior and anterolateral ischemia.  Q in aVL and I, cannot exclude high lateral inferct old. Early R transition, consider RVH.  Consider to posterior infarct. No significant change from 10/05/2018.  Assessment   No diagnosis found.   No orders of the defined types were placed in this encounter.  There are no discontinued medications.   Recommendations:   Amanda Dean  is a 70 y.o.  with history of stroke, S/P ASD repair with 28-mm Starflex septal occluder closure of the ASD 05/05/2008, history of tobacco use disorder with COPD and quit smoking in 2015, hypertension, asymptomatic carotid artery stenosis and hyperlipidemia.   She presents for annual visit and follow-up of carotid artery stenosis, hypertension, hyperlipidemia. Given patient's symptoms of decreased exercise tolerance, dyspnea on exertion, and increased bilateral ankle swelling would recommend further evaluation including nuclear stress test and echocardiogram. Patient's EKG unchanged compared to previous in the office today, however cannot exclude ischemia. Therefore although patient's symptoms of chest discomfort are atypical, cannot ruling our underlying ischemia particularly given dyspnea on exertion. Will obtain stress test to evaluate for underlying ischemia and  echocardiogram to evaluate LV function.   Counseled patient regarding signs and symptoms that would warrant urgent/emergent evaluation, she verbalized understanding and agreement.   Given patient's concern regarding hair loss related to statin therapy, will discontinue simvastatin and switch patient to pravastatin 20 mg nightly along with Zetia 10 mg.  Patient has upcoming carotid duplex and follow up with Dr. Einar Gip. Patient prefers to keep  this appointment and follow up with Dr. Einar Gip for further management.    Alethia Berthold, PA-C 10/20/2020, 11:00 AM Office: [phone]

## 2020-10-24 ENCOUNTER — Other Ambulatory Visit: Payer: Medicare Other

## 2020-12-27 ENCOUNTER — Other Ambulatory Visit: Payer: Self-pay

## 2020-12-27 MED ORDER — PRAVASTATIN SODIUM 20 MG PO TABS: 20.0000 mg | ORAL_TABLET | Freq: Every evening | ORAL | 3 refills | Status: DC

## 2021-02-15 ENCOUNTER — Other Ambulatory Visit: Payer: Self-pay | Admitting: Cardiology

## 2021-04-25 ENCOUNTER — Ambulatory Visit: Payer: Medicare Other

## 2021-04-25 ENCOUNTER — Other Ambulatory Visit: Payer: Self-pay

## 2021-04-25 DIAGNOSIS — I6523 Occlusion and stenosis of bilateral carotid arteries: Secondary | ICD-10-CM

## 2021-05-04 NOTE — Progress Notes (Signed)
Please schedule carotid artery duplex prior to her next office visit on 10/20/2021.  Orders have been placed.

## 2021-05-04 NOTE — Progress Notes (Signed)
Carotid artery duplex 04/25/2021: Duplex suggests stenosis in the right internal carotid artery (50-69%). Duplex suggests stenosis in the right external carotid artery (<50%). Duplex suggests stenosis in the left internal carotid artery (minimal). Duplex suggests stenosis in the left external carotid artery (<50%). Antegrade right vertebral artery flow. Antegrade left vertebral artery flow. 10/20/2020, left ICA stenosis of 50-69% not present and no change in right ICA stenosis. Follow up in six months is appropriate if clinically indicated.

## 2021-05-16 ENCOUNTER — Other Ambulatory Visit: Payer: Self-pay

## 2021-05-16 ENCOUNTER — Encounter: Payer: Self-pay | Admitting: Student

## 2021-05-16 ENCOUNTER — Ambulatory Visit: Payer: Medicare Other | Admitting: Student

## 2021-05-16 VITALS — BP 136/70 | HR 69 | Temp 98.1°F | Ht 62.0 in | Wt 176.0 lb

## 2021-05-16 DIAGNOSIS — I6523 Occlusion and stenosis of bilateral carotid arteries: Secondary | ICD-10-CM

## 2021-05-16 DIAGNOSIS — R5383 Other fatigue: Secondary | ICD-10-CM

## 2021-05-16 DIAGNOSIS — R0609 Other forms of dyspnea: Secondary | ICD-10-CM

## 2021-05-16 NOTE — Progress Notes (Signed)
Primary Physician/Referring:  [name], NP  Patient ID: Amanda Dean, female    DOB: 02-10-1951, 71 y.o.   MRN: [id number]  Chief Complaint  Patient presents with   Fatigue   Follow-up     HPI: Amanda Dean  is a 71 y.o. female  with history of stroke, S/P ASD repair with 28-mm Starflex septal occluder closure of the ASD 05/05/2008, history of tobacco use disorder with COPD and quit smoking in 2015, hypertension, asymptomatic carotid artery stenosis and hyperlipidemia.   Patient was last seen in our office 10/20/2020 at which time she was stable from a cardiovascular standpoint with essentially unremarkable echocardiogram and stable carotid artery stenosis.  She had had no recurrence of chest pain, therefore previously ordered stress test was canceled.  She was advised at that time to follow-up in 1 year.  Patient now presents for urgent visit at her request with complaints of fatigue.  Patient was treated by PCP for recurrent sinusitis at the end of December.  She is continued to feel worsening fatigue over the last 3 weeks despite completing antibiotic course.  She also has had occasional episodes of chest discomfort both at rest and with exertion.  Denies shortness of breath, leg swelling, orthopnea, PND.  Notably patient has not been active as she states she does not feel motivated to do anything due to fatigue.  In October 2022 she was walking regularly about 3 days/week without issue.  She has not exercised in quite some time but does report that until about 3 weeks ago she was active around her house.  Patient is also concerned regarding bilateral pain in the upper legs which she is concerned may be related to medications.  She states this pain is worse at night causing her to not sleep well.   Past Medical History:  Diagnosis Date   ASD (atrial septal defect)    PFO no significant shunt   COPD (chronic obstructive pulmonary disease) (HCC)    CVA (cerebral infarction)    Lt MCA ,  2001 failed cerebral angioplasty x 2   Hyperlipidemia    Hypoxemia    LVH (left ventricular hypertrophy)    Tobacco abuse    Past Surgical History:  Procedure Laterality Date   ASD REPAIR  05/05/2008   Dr. Ophelia Shoulder 39m device   CARDIAC CATHETERIZATION  12/11/2007   10-20% LUMINAL IRREGULARITY MID LAD, otherwise normal coronary arteries   Cerebral angioplasty     NM MYOVIEW LTD  01/12/2011   no ischemia   [place]  10/25/2011   mod. concentric  LVH,EF 60-65%,trace MR   Family History  Problem Relation Age of Onset   Rheumatic fever Mother    Prostate cancer Father    Social History   Tobacco Use   Smoking status: Former    Packs/day: 1.00    Years: 35.00    Pack years: 35.00    Types: Cigarettes    Quit date: 06/17/2013    Years since quitting: 7.9   Smokeless tobacco: Never  Substance Use Topics   Alcohol use: Yes    Alcohol/week: 6.0 standard drinks    Types: 3 Glasses of wine, 3 Cans of beer per week    Comment: rarely   Marital Status: Single   ROS   Review of Systems  Constitutional: Positive for malaise/fatigue. Negative for weight gain.  Cardiovascular:  Positive for chest pain. Negative for claudication, dyspnea on exertion, leg swelling, near-syncope, orthopnea, palpitations, paroxysmal nocturnal dyspnea and syncope.  Respiratory:  Negative for shortness of breath.   Hematologic/Lymphatic: Does not bruise/bleed easily.  Musculoskeletal:  Positive for joint pain.  Gastrointestinal:  Negative for melena.  Neurological:  Negative for dizziness and weakness.  Objective  Blood pressure 136/70, pulse 69, temperature 98.1 F (36.7 C), temperature source Temporal, height 5' 2"  (1.575 m), weight 176 lb (79.8 kg), SpO2 95 %. Body mass index is 32.19 kg/m. Vitals with BMI 05/16/2021 10/20/2020 10/20/2020  Height 5' 2"  - 5' 2"   Weight 176 lbs - 188 lbs  BMI 14.78 - 29.56  Systolic 213 086 578  Diastolic 70 51 62  Pulse 69 71 58       Physical  Exam Vitals reviewed.  Constitutional:      General: She is not in acute distress.    Appearance: She is well-developed.     Comments: Mildly obese  Neck:     Thyroid: No thyromegaly.     Vascular: No JVD.  Cardiovascular:     Rate and Rhythm: Normal rate and regular rhythm.     Pulses: Intact distal pulses.          Carotid pulses are  on the left side with bruit.      Dorsalis pedis pulses are 1+ on the right side and 1+ on the left side.       Posterior tibial pulses are 2+ on the right side and 2+ on the left side.     Heart sounds: S1 normal and S2 normal. Murmur heard.  Early systolic murmur is present with a grade of 2/6 at the upper right sternal border.    No gallop.  Pulmonary:     Effort: Pulmonary effort is normal.     Breath sounds: No rales.  Musculoskeletal:     Cervical back: Neck supple.     Right lower leg: No edema.     Left lower leg: No edema.    Laboratory examination:   No flowsheet data found. CBC Latest Ref Rng & Units 05/06/2008 05/05/2008  WBC 4.0 - 10.5 K/uL 8.9 8.2  Hemoglobin 12.0 - 15.0 g/dL 12.9 13.6  Hematocrit 36.0 - 46.0 % 37.5 40.3  Platelets 150 - 400 K/uL 199 186   Lipid Panel     Component Value Date/Time   CHOL 144 01/28/2020 0957   TRIG 54 01/28/2020 0957   HDL 70 01/28/2020 0957   LDLCALC 62 01/28/2020 0957   HEMOGLOBIN A1C No results found for: HGBA1C, MPG TSH No results for input(s): TSH in the last 8760 hours.  External labs:  08/12/2020: Sodium 141, potassium 5.0, BUN 24, glucose 111, creatinine 0.83, alk phos 37, AST 20, ALT 26, GFR 76  06/19/2019: Sodium 139, potassium 4.0, BUN 23, creatinine 0.68, EGFR >60 mL, serum glucose 95 mg. TSH normal. Hb 13.1/HCT 38.2, platelets 213.  10/05/2018:  BUN 19, creatinine 0.65, eGFR greater than 60 mL.  CMP otherwise normal.  Serum calcium was minimally elevated for the 1st time at 10.7.  D-dimer normal.  04/17/2018: Cholesterol 140, triglycerides 60, HDL 63, LDL 65. Creatinine  0.61, eGFR 93/108, Potassium 4.6, CMP normal.  Allergies   Allergies  Allergen Reactions   Crestor [Rosuvastatin] Other (See Comments)    Memory loss     Medications Prior to Visit:   Outpatient Medications Prior to Visit  Medication Sig Dispense Refill   amLODipine (NORVASC) 5 MG tablet TAKE 1 TABLET DAILY 90 tablet 3   aspirin 81 MG tablet Take 81 mg by mouth daily.  ezetimibe (ZETIA) 10 MG tablet Take 1 tablet (10 mg total) by mouth daily. 90 tablet 3   hydrochlorothiazide (MICROZIDE) 12.5 MG capsule Take 12.5 mg by mouth as needed.     pravastatin (PRAVACHOL) 20 MG tablet Take 1 tablet (20 mg total) by mouth every evening. 90 tablet 3   solifenacin (VESICARE) 5 MG tablet Take 2 tablets by mouth daily.     spironolactone (ALDACTONE) 25 MG tablet TAKE 1 TABLET EVERY MORNING (Patient taking differently: Take 12.5 mg by mouth once.) 90 tablet 3   topiramate (TOPAMAX) 25 MG tablet Take 1 tablet by mouth daily.     No facility-administered medications prior to visit.   Final Medications at End of Visit    Current Meds  Medication Sig   amLODipine (NORVASC) 5 MG tablet TAKE 1 TABLET DAILY   aspirin 81 MG tablet Take 81 mg by mouth daily.   ezetimibe (ZETIA) 10 MG tablet Take 1 tablet (10 mg total) by mouth daily.   hydrochlorothiazide (MICROZIDE) 12.5 MG capsule Take 12.5 mg by mouth as needed.   pravastatin (PRAVACHOL) 20 MG tablet Take 1 tablet (20 mg total) by mouth every evening.   solifenacin (VESICARE) 5 MG tablet Take 2 tablets by mouth daily.   spironolactone (ALDACTONE) 25 MG tablet TAKE 1 TABLET EVERY MORNING (Patient taking differently: Take 12.5 mg by mouth once.)   topiramate (TOPAMAX) 25 MG tablet Take 1 tablet by mouth daily.   Cardiac Studies:   Lexiscan sestamibi stress test 11/09/2013:  1. The resting electrocardiogram demonstrated normal sinus rhythm, no resting arrhythmias and nonspecific ST-T changes. Cannot r/o inferior and antererolateral ischemia. Stress  EKG is nondiagnostic for ischemia as it low pharmacologic stress test. Stress symptoms included shortness of breath. 2. Myocardial perfusion imaging is normal. Overall left ventricular systolic function was normal without regional wall motion abnormalities. The left ventricular ejection fraction was calculated or visually estimated to be 58%.  Echocardiogram 10/20/2020: Normal LV systolic function with EF 55%. Left ventricle cavity is normal in size. Moderate concentric hypertrophy of the left ventricle. Normal global wall motion. Doppler evidence of grade II (pseudonormal) diastolic dysfunction, elevated LAP. Calculated EF 55%. Left atrial cavity is moderately dilated by volume. Structurally normal mitral valve.  Mild (Grade I) mitral regurgitation. Structurally normal tricuspid valve.  Mild tricuspid regurgitation. No evidence of pulmonary hypertension. Compared to 16/01/9603, grade I diastolic dysfunction is now grade II.  Carotid artery duplex 04/25/2021: Duplex suggests stenosis in the right internal carotid artery (50-69%). Duplex suggests stenosis in the right external carotid artery (<50%). Duplex suggests stenosis in the left internal carotid artery (minimal). Duplex suggests stenosis in the left external carotid artery (<50%). Antegrade right vertebral artery flow. Antegrade left vertebral artery flow. 10/20/2020, left ICA stenosis of 50-69% not present and no change in right ICA stenosis. Follow up in six months is appropriate if clinically indicated.  EKG  09/13/2021: Sinus rhythm at a rate of 60 bpm.  Normal axis.  LVH with secondary repolarization abnormality, cannot exclude ischemia.  Cannot exclude lateral infarct old.  Unchanged compared to EKG 10/03/2020.  10/03/2020: Sinus rhythm at a rate of 60 bpm.  Normal axis.  LVH with secondary repolarization abnormality, cannot exclude ischemia.  Q waves, cannot exclude lateral infarct old. Compared to EKG 10/21/2019 no significant change.    10/21/2019: Sinus bradycardia at rate of 55 bpm, normal axis, LVH with repolarization abnormality, cannot exclude inferior and anterolateral ischemia.  Q in aVL and I, cannot exclude high lateral  inferct old. Early R transition, consider RVH.  Consider to posterior infarct. No significant change from 10/05/2018.  Assessment     ICD-10-CM   1. Other fatigue  R53.83 EKG 12-Lead    PCV MYOCARDIAL PERFUSION WO LEXISCAN    2. Asymptomatic bilateral carotid artery stenosis  I65.23     3. Dyspnea on exertion  R06.09 PCV MYOCARDIAL PERFUSION WO LEXISCAN     No orders of the defined types were placed in this encounter.   There are no discontinued medications.  Recommendations:   Amanda Dean  is a 71 y.o.  with history of stroke, S/P ASD repair with 28-mm Starflex septal occluder closure of the ASD 05/05/2008, history of tobacco use disorder with COPD and quit smoking in 2015, hypertension, asymptomatic carotid artery stenosis and hyperlipidemia.   Patient was last seen in our office 10/20/2020 at which time she was stable from a cardiovascular standpoint with essentially unremarkable echocardiogram and stable carotid artery stenosis.  She had had no recurrence of chest pain, therefore previously ordered stress test was canceled.  She was advised at that time to follow-up in 1 year.  Patient now presents for urgent visit at her request with complaints of fatigue.  Patient's EKG and physical exam are unchanged.  Suspect patient's fatigue is multifactorial including deconditioning and continued recovery from recent sinusitis.  Also recommend considering depression screening with PCP.  However as patient has had occasional episodes of chest pain since last office visit and worsening fatigue with decreased exercise tolerance shared decision was to proceed with previously ordered stress test.  Also personally reviewed external labs done by PCP earlier today,Lipids are well controlled.  TSH is normal  and hemoglobin hematocrit normal.  Blood pressure is presently well controlled.  Given concern of muscle pains, recommended 2-week statin holiday.  Patient will hold statin therapy for 2 weeks.  If muscle pain improves she will notify our office, however if it does not advised her to restart statin therapy.  She verbalized understanding and agreement.  Patient does have carotid artery stenosis, plan for 51-monthsurveillance.  Follow-up in 6 weeks, sooner if needed, for fatigue and results of cardiac testing.   CAlethia Berthold PA-C 05/16/2021, 3:55 PM Office: [phone]

## 2021-06-12 ENCOUNTER — Other Ambulatory Visit: Payer: Medicare Other

## 2021-06-21 ENCOUNTER — Other Ambulatory Visit: Payer: Self-pay

## 2021-06-21 ENCOUNTER — Ambulatory Visit: Payer: Medicare Other

## 2021-06-21 DIAGNOSIS — R0609 Other forms of dyspnea: Secondary | ICD-10-CM

## 2021-06-21 DIAGNOSIS — R5383 Other fatigue: Secondary | ICD-10-CM

## 2021-06-27 ENCOUNTER — Ambulatory Visit: Payer: Medicare Other | Admitting: Student

## 2021-06-27 ENCOUNTER — Encounter: Payer: Self-pay | Admitting: Student

## 2021-06-27 ENCOUNTER — Other Ambulatory Visit: Payer: Self-pay

## 2021-06-27 VITALS — BP 123/74 | HR 59 | Temp 98.2°F | Resp 16 | Ht 62.0 in

## 2021-06-27 DIAGNOSIS — I6523 Occlusion and stenosis of bilateral carotid arteries: Secondary | ICD-10-CM

## 2021-06-27 DIAGNOSIS — I10 Essential (primary) hypertension: Secondary | ICD-10-CM

## 2021-06-27 DIAGNOSIS — R5383 Other fatigue: Secondary | ICD-10-CM

## 2021-06-27 NOTE — Progress Notes (Signed)
Primary Physician/Referring:  [name], NP  Patient ID: Amanda Dean, female    DOB: [birth year], 71 y.o.   MRN: [id number]  Chief Complaint  Patient presents with   Fatigue   Results   Follow-up    6 weeks     HPI: Amanda Dean  is a 71 y.o. female  with history of stroke, S/P ASD repair with 28-mm Starflex septal occluder closure of the ASD 05/05/2008, history of tobacco use disorder with COPD and quit smoking in 2015, hypertension, asymptomatic carotid artery stenosis and hyperlipidemia.   Patient presents for 6-week follow-up for results of cardiac testing and fatigue.  Patient reports fatigue has significantly improved since recovering from sinusitis at last office visit.  She also reports no recurrence of chest pain episodes.  Patient tried statin holiday and noticed no improvement of muscle pain, therefore she resumed pravastatin.  She has recently been diagnosed with bursitis and this is being treated, which has improved muscle pains.  Patient's blood pressure is well controlled today.  She also reports blood pressures well controlled on home monitoring averaging 130s/70s mmHg.  She is currently taking spironolactone 12.5 mg and has had no recurrence of episodes of soft blood pressure.  Notably patient's blood pressure was high at her recent stress test, however she admits to feelings of anxiousness that day about the stress test, as well as she had not taken her blood pressure medications that morning.  Past Medical History:  Diagnosis Date   ASD (atrial septal defect)    PFO no significant shunt   COPD (chronic obstructive pulmonary disease) (HCC)    CVA (cerebral infarction)    Lt MCA , 2001 failed cerebral angioplasty x 2   Hyperlipidemia    Hypoxemia    LVH (left ventricular hypertrophy)    Tobacco abuse    Past Surgical History:  Procedure Laterality Date   ASD REPAIR  05/05/2008   Dr. Ophelia Shoulder 62m device   CARDIAC CATHETERIZATION  12/11/2007   10-20% LUMINAL  IRREGULARITY MID LAD, otherwise normal coronary arteries   Cerebral angioplasty     NM MYOVIEW LTD  01/12/2011   no ischemia   [place]  10/25/2011   mod. concentric  LVH,EF 60-65%,trace MR   Family History  Problem Relation Age of Onset   Rheumatic fever Mother    Prostate cancer Father    Social History   Tobacco Use   Smoking status: Former    Packs/day: 1.00    Years: 35.00    Pack years: 35.00    Types: Cigarettes    Quit date: 06/17/2013    Years since quitting: 8.0   Smokeless tobacco: Never  Substance Use Topics   Alcohol use: Yes    Alcohol/week: 6.0 standard drinks    Types: 3 Glasses of wine, 3 Cans of beer per week    Comment: rarely   Marital Status: Single   ROS   Review of Systems  Constitutional: Negative for malaise/fatigue (improved) and weight gain.  Cardiovascular:  Negative for chest pain (no recurrence), claudication, dyspnea on exertion, leg swelling, near-syncope, orthopnea, palpitations, paroxysmal nocturnal dyspnea and syncope.  Respiratory:  Negative for shortness of breath.   Musculoskeletal:  Positive for joint pain.  Neurological:  Negative for dizziness.  Objective  Blood pressure 123/74, pulse (!) 59, temperature 98.2 F (36.8 C), temperature source Temporal, resp. rate 16, height 5' 2"  (1.575 m), SpO2 96 %. Body mass index is 32.19 kg/m. Vitals with BMI 06/27/2021 05/16/2021 10/20/2020  Height 5' 2"  5' 2"  -  Weight - 176 lbs -  BMI - 08.02 -  Systolic 233 612 244  Diastolic 74 70 51  Pulse 59 69 71   Physical Exam Vitals reviewed.  Constitutional:      General: She is not in acute distress.    Appearance: She is well-developed.     Comments: Mildly obese  Neck:     Thyroid: No thyromegaly.     Vascular: No JVD.  Cardiovascular:     Rate and Rhythm: Normal rate and regular rhythm.     Pulses: Intact distal pulses.          Carotid pulses are  on the left side with bruit.      Dorsalis pedis pulses are 1+ on the right  side and 1+ on the left side.       Posterior tibial pulses are 2+ on the right side and 2+ on the left side.     Heart sounds: S1 normal and S2 normal. Murmur heard.  Early systolic murmur is present with a grade of 2/6 at the upper right sternal border.    No gallop.  Pulmonary:     Effort: Pulmonary effort is normal.     Breath sounds: Normal breath sounds.  Musculoskeletal:     Cervical back: Neck supple.     Right lower leg: No edema.     Left lower leg: No edema.    Laboratory examination:   No flowsheet data found. CBC Latest Ref Rng & Units 05/06/2008 05/05/2008  WBC 4.0 - 10.5 K/uL 8.9 8.2  Hemoglobin 12.0 - 15.0 g/dL 12.9 13.6  Hematocrit 36.0 - 46.0 % 37.5 40.3  Platelets 150 - 400 K/uL 199 186   Lipid Panel     Component Value Date/Time   CHOL 144 01/28/2020 0957   TRIG 54 01/28/2020 0957   HDL 70 01/28/2020 0957   LDLCALC 62 01/28/2020 0957   HEMOGLOBIN A1C No results found for: HGBA1C, MPG TSH No results for input(s): TSH in the last 8760 hours.  External labs:  08/12/2020: Sodium 141, potassium 5.0, BUN 24, glucose 111, creatinine 0.83, alk phos 37, AST 20, ALT 26, GFR 76  06/19/2019: Sodium 139, potassium 4.0, BUN 23, creatinine 0.68, EGFR >60 mL, serum glucose 95 mg. TSH normal. Hb 13.1/HCT 38.2, platelets 213.  10/05/2018:  BUN 19, creatinine 0.65, eGFR greater than 60 mL.  CMP otherwise normal.  Serum calcium was minimally elevated for the 1st time at 10.7.  D-dimer normal.  04/17/2018: Cholesterol 140, triglycerides 60, HDL 63, LDL 65. Creatinine 0.61, eGFR 93/108, Potassium 4.6, CMP normal.  Allergies   Allergies  Allergen Reactions   Crestor [Rosuvastatin] Other (See Comments)    Memory loss     Medications Prior to Visit:   Outpatient Medications Prior to Visit  Medication Sig Dispense Refill   amLODipine (NORVASC) 5 MG tablet TAKE 1 TABLET DAILY 90 tablet 3   aspirin 81 MG tablet Take 81 mg by mouth daily.     ezetimibe (ZETIA) 10 MG  tablet Take 1 tablet (10 mg total) by mouth daily. 90 tablet 3   pravastatin (PRAVACHOL) 20 MG tablet Take 1 tablet (20 mg total) by mouth every evening. 90 tablet 3   solifenacin (VESICARE) 5 MG tablet Take 2 tablets by mouth daily.     spironolactone (ALDACTONE) 25 MG tablet TAKE 1 TABLET EVERY MORNING (Patient taking differently: Take 12.5 mg by mouth once.) 90 tablet 3  topiramate (TOPAMAX) 25 MG tablet Take 1 tablet by mouth daily.     cetirizine (ZYRTEC) 10 MG tablet Take 1 tablet by mouth daily.     fluticasone (FLONASE) 50 MCG/ACT nasal spray Place 1-2 sprays into the nose as needed.     guaiFENesin (MUCINEX) 600 MG 12 hr tablet Take 1 tablet by mouth daily.     hydrochlorothiazide (MICROZIDE) 12.5 MG capsule Take 12.5 mg by mouth as needed. (Patient not taking: Reported on 06/27/2021)     meloxicam (MOBIC) 7.5 MG tablet Take 7.5 mg by mouth daily.     No facility-administered medications prior to visit.   Final Medications at End of Visit    Current Meds  Medication Sig   amLODipine (NORVASC) 5 MG tablet TAKE 1 TABLET DAILY   aspirin 81 MG tablet Take 81 mg by mouth daily.   ezetimibe (ZETIA) 10 MG tablet Take 1 tablet (10 mg total) by mouth daily.   pravastatin (PRAVACHOL) 20 MG tablet Take 1 tablet (20 mg total) by mouth every evening.   solifenacin (VESICARE) 5 MG tablet Take 2 tablets by mouth daily.   spironolactone (ALDACTONE) 25 MG tablet TAKE 1 TABLET EVERY MORNING (Patient taking differently: Take 12.5 mg by mouth once.)   topiramate (TOPAMAX) 25 MG tablet Take 1 tablet by mouth daily.   Cardiac Studies:   Echocardiogram 10/20/2020: Normal LV systolic function with EF 55%. Left ventricle cavity is normal in size. Moderate concentric hypertrophy of the left ventricle. Normal global wall motion. Doppler evidence of grade II (pseudonormal) diastolic dysfunction, elevated LAP. Calculated EF 55%. Left atrial cavity is moderately dilated by volume. Structurally normal mitral  valve.  Mild (Grade I) mitral regurgitation. Structurally normal tricuspid valve.  Mild tricuspid regurgitation. No evidence of pulmonary hypertension. Compared to 69/79/4801, grade I diastolic dysfunction is now grade II.  Carotid artery duplex 04/25/2021: Duplex suggests stenosis in the right internal carotid artery (50-69%). Duplex suggests stenosis in the right external carotid artery (<50%). Duplex suggests stenosis in the left internal carotid artery (minimal). Duplex suggests stenosis in the left external carotid artery (<50%). Antegrade right vertebral artery flow. Antegrade left vertebral artery flow. 10/20/2020, left ICA stenosis of 50-69% not present and no change in right ICA stenosis. Follow up in six months is appropriate if clinically indicated.  PCV MYOCARDIAL PERFUSION WO LEXISCAN 06/21/2021 Exercise nuclear stress test was performed using Bruce protocol. Patient reached 5.9 METS, and 91% of age predicted maximum heart rate. Exercise capacity was low. No chest pain reported. Normal heart rate response. Resting hypertension 140/80 mmHg, with hypertensive exercise response. peak BP 209/60 mmHg. Stress EKG revealed no ischemic changes. Normal myocardial perfusion. Stress LVEF 77%. Low risk study. Recommend hypertension management.  EKG  09/13/2021: Sinus rhythm at a rate of 60 bpm.  Normal axis.  LVH with secondary repolarization abnormality, cannot exclude ischemia.  Cannot exclude lateral infarct old.  Unchanged compared to EKG 10/03/2020.  10/03/2020: Sinus rhythm at a rate of 60 bpm.  Normal axis.  LVH with secondary repolarization abnormality, cannot exclude ischemia.  Q waves, cannot exclude lateral infarct old. Compared to EKG 10/21/2019 no significant change.   10/21/2019: Sinus bradycardia at rate of 55 bpm, normal axis, LVH with repolarization abnormality, cannot exclude inferior and anterolateral ischemia.  Q in aVL and I, cannot exclude high lateral inferct old. Early R  transition, consider RVH.  Consider to posterior infarct. No significant change from 10/05/2018.  Assessment     ICD-10-CM   1. Other  fatigue  R53.83     2. Asymptomatic bilateral carotid artery stenosis  I65.23     3. Essential hypertension  I10       No orders of the defined types were placed in this encounter.   There are no discontinued medications.  Recommendations:   Amanda Dean  is a 71 y.o.  with history of stroke, S/P ASD repair with 28-mm Starflex septal occluder closure of the ASD 05/05/2008, history of tobacco use disorder with COPD and quit smoking in 2015, hypertension, asymptomatic carotid artery stenosis and hyperlipidemia.   Patient presents for 6-week follow-up for results of cardiac testing.  Reviewed and discussed results of stress test, which was overall low risk.  Details are above and patient's questions were addressed to her satisfaction.  Patient's symptoms have significantly improved since last office visit.  Suspect fatigue and chest pain were of noncardiac etiology.  Advised patient to continue pravastatin as muscle pain was likely related to underlying bursitis.  She is scheduled for regular surveillance of carotid artery stenosis.  Blood pressure is well controlled.  Suspect elevated blood pressure at stress test was related to anxiety and that patient had not taken her antihypertensive medications that day.  Continue amlodipine, aspirin, Zetia, pravastatin, and spironolactone.  Patient will follow-up with Dr. Einar Gip as previously scheduled per her preference.   Alethia Berthold, PA-C 06/27/2021, 12:03 PM Office: [phone]

## 2021-10-15 ENCOUNTER — Other Ambulatory Visit: Payer: Self-pay | Admitting: Student

## 2021-10-20 ENCOUNTER — Ambulatory Visit: Payer: Medicare Other | Admitting: Cardiology

## 2021-10-25 ENCOUNTER — Encounter: Payer: Self-pay | Admitting: Cardiology

## 2021-10-25 ENCOUNTER — Ambulatory Visit: Payer: Medicare Other

## 2021-10-25 ENCOUNTER — Ambulatory Visit: Payer: Medicare Other | Admitting: Cardiology

## 2021-10-25 VITALS — BP 140/80 | HR 73 | Temp 97.4°F | Resp 16 | Ht 62.0 in | Wt 181.2 lb

## 2021-10-25 DIAGNOSIS — I6523 Occlusion and stenosis of bilateral carotid arteries: Secondary | ICD-10-CM

## 2021-10-25 DIAGNOSIS — E7800 Pure hypercholesterolemia, unspecified: Secondary | ICD-10-CM

## 2021-10-25 DIAGNOSIS — I10 Essential (primary) hypertension: Secondary | ICD-10-CM

## 2021-10-25 MED ORDER — PRAVASTATIN SODIUM 40 MG PO TABS: 40.0000 mg | ORAL_TABLET | Freq: Every evening | ORAL | 3 refills | Status: DC

## 2021-10-25 MED ORDER — VALSARTAN-HYDROCHLOROTHIAZIDE 160-12.5 MG PO TABS: 1.0000 | ORAL_TABLET | Freq: Every morning | ORAL | 2 refills | Status: DC

## 2021-10-25 NOTE — Progress Notes (Unsigned)
Primary Physician/Referring:  [name], NP  Patient ID: Amanda Dean, female    DOB: January 16, 1951, 71 y.o.   MRN: [id number]  Chief Complaint  Patient presents with   Carotid stenosis   Hypertension   Hyperlipidemia   Follow-up    1 year     HPI: Amanda Dean  is a 71 y.o. female  with history of stroke, S/P ASD repair with 28-mm Starflex septal occluder closure of the ASD 05/05/2008, history of tobacco use disorder with COPD and quit smoking in 2015, hypertension, asymptomatic carotid artery stenosis and hyperlipidemia.   Patient presents for 6-week follow-up for results of cardiac testing and fatigue.  Patient reports fatigue has significantly improved since recovering from sinusitis at last office visit.  She also reports no recurrence of chest pain episodes.  Patient tried statin holiday and noticed no improvement of muscle pain, therefore she resumed pravastatin.  She has recently been diagnosed with bursitis and this is being treated, which has improved muscle pains.  Patient's blood pressure is well controlled today.  She also reports blood pressures well controlled on home monitoring averaging 130s/70s mmHg.  She is currently taking spironolactone 12.5 mg and has had no recurrence of episodes of soft blood pressure.  Notably patient's blood pressure was high at her recent stress test, however she admits to feelings of anxiousness that day about the stress test, as well as she had not taken her blood pressure medications that morning.  Past Medical History:  Diagnosis Date   ASD (atrial septal defect)    PFO no significant shunt   COPD (chronic obstructive pulmonary disease) (HCC)    CVA (cerebral infarction)    Lt MCA , 2001 failed cerebral angioplasty x 2   Hyperlipidemia    Hypoxemia    LVH (left ventricular hypertrophy)    Tobacco abuse    Past Surgical History:  Procedure Laterality Date   ASD REPAIR  05/05/2008   Dr. Ophelia Shoulder 81m device   CARDIAC CATHETERIZATION   12/11/2007   10-20% LUMINAL IRREGULARITY MID LAD, otherwise normal coronary arteries   Cerebral angioplasty     NM MYOVIEW LTD  01/12/2011   no ischemia   [place]  10/25/2011   mod. concentric  LVH,EF 60-65%,trace MR   Family History  Problem Relation Age of Onset   Rheumatic fever Mother    Prostate cancer Father    Social History   Tobacco Use   Smoking status: Former    Packs/day: 1.00    Years: 35.00    Total pack years: 35.00    Types: Cigarettes    Quit date: 06/17/2013    Years since quitting: 8.3   Smokeless tobacco: Never  Substance Use Topics   Alcohol use: Yes    Alcohol/week: 6.0 standard drinks of alcohol    Types: 3 Glasses of wine, 3 Cans of beer per week    Comment: rarely   Marital Status: Single   ROS   Review of Systems  Constitutional: Negative for malaise/fatigue (improved) and weight gain.  Cardiovascular:  Negative for chest pain (no recurrence), claudication, dyspnea on exertion, leg swelling, near-syncope, orthopnea, palpitations, paroxysmal nocturnal dyspnea and syncope.  Respiratory:  Negative for shortness of breath.   Musculoskeletal:  Positive for joint pain.  Neurological:  Negative for dizziness.   Objective  Blood pressure 140/80, pulse 73, temperature (!) 97.4 F (36.3 C), temperature source Temporal, resp. rate 16, height 5' 2"  (1.575 m), weight 181 lb 3.2 oz (82.2 kg), SpO2  93 %. Body mass index is 33.14 kg/m.    10/25/2021    3:47 PM 10/25/2021    2:40 PM 06/27/2021   11:26 AM  Vitals with BMI  Height  5' 2"  5' 2"   Weight  181 lbs 3 oz   BMI  63.87   Systolic 564 332 951  Diastolic 80 75 74  Pulse  73 59   Physical Exam Vitals reviewed.  Constitutional:      General: She is not in acute distress.    Appearance: She is well-developed.     Comments: Mildly obese  Neck:     Thyroid: No thyromegaly.     Vascular: No JVD.  Cardiovascular:     Rate and Rhythm: Normal rate and regular rhythm.     Pulses: Intact  distal pulses.          Carotid pulses are  on the left side with bruit.      Dorsalis pedis pulses are 1+ on the right side and 1+ on the left side.       Posterior tibial pulses are 2+ on the right side and 2+ on the left side.     Heart sounds: S1 normal and S2 normal. Murmur heard.     Early systolic murmur is present with a grade of 2/6 at the upper right sternal border.     No gallop.  Pulmonary:     Effort: Pulmonary effort is normal.     Breath sounds: Normal breath sounds.  Musculoskeletal:     Cervical back: Neck supple.     Right lower leg: No edema.     Left lower leg: No edema.    Laboratory examination:   External labs:   Labs 05/16/2021:  Sodium 140, potassium 4.4, serum glucose 101 mg, BUN 21, creatinine 0.80, EGFR 79 mL, LFTs normal.  Total cholesterol 163, triglycerides 94, HDL 62, LDL 77.  TSH normal at 2.08.  Vitamin D normal at 30.0.  A1c 6.3%.  Hb 13.5/HCT 39.9, platelets 242, normal indicis.  Allergies   Allergies  Allergen Reactions   Spironolactone Other (See Comments)    Hair loss   Crestor [Rosuvastatin] Other (See Comments)    Memory loss     Final Medications at End of Visit    Current Outpatient Medications:    amLODipine (NORVASC) 5 MG tablet, TAKE 1 TABLET DAILY, Disp: 90 tablet, Rfl: 3   aspirin 81 MG tablet, Take 81 mg by mouth daily., Disp: , Rfl:    cetirizine (ZYRTEC) 10 MG tablet, Take 1 tablet by mouth daily., Disp: , Rfl:    ezetimibe (ZETIA) 10 MG tablet, Take 1 tablet (10 mg total) by mouth daily., Disp: 90 tablet, Rfl: 3   fluticasone (FLONASE) 50 MCG/ACT nasal spray, Place 1-2 sprays into the nose as needed., Disp: , Rfl:    guaiFENesin (MUCINEX) 600 MG 12 hr tablet, Take 1 tablet by mouth daily., Disp: , Rfl:    solifenacin (VESICARE) 5 MG tablet, Take 2 tablets by mouth daily., Disp: , Rfl:    valsartan-hydrochlorothiazide (DIOVAN-HCT) 160-12.5 MG tablet, Take 1 tablet by mouth in the morning., Disp: 30 tablet, Rfl: 2    pravastatin (PRAVACHOL) 40 MG tablet, Take 1 tablet (40 mg total) by mouth every evening., Disp: 90 tablet, Rfl: 3    Radiology:   CT scan for lung cancer screening 05/23/2021: LUNGS: Patchy nodular density left lower lobe adjacent to the fissure  likely reflects chronic scarring with mild thickening of  the bronchi within the left lower lobe. Stable solid nodule within the right lower lobe medially 7 mm.  PLEURA: No pleural thickening or effusion.  HEART: Heart size normal. No pericardial effusion.  CORONARY ARTERY CALCIFICATION: None Cardiac Studies:   Echocardiogram 10/20/2020: Normal LV systolic function with EF 55%. Left ventricle cavity is normal in size. Moderate concentric hypertrophy of the left ventricle. Normal global wall motion. Doppler evidence of grade II (pseudonormal) diastolic dysfunction, elevated LAP. Calculated EF 55%. Left atrial cavity is moderately dilated by volume. Structurally normal mitral valve.  Mild (Grade I) mitral regurgitation. Structurally normal tricuspid valve.  Mild tricuspid regurgitation. No evidence of pulmonary hypertension. Compared to 19/37/9024, grade I diastolic dysfunction is now grade II.  PCV MYOCARDIAL PERFUSION WO LEXISCAN 06/21/2021 Exercise nuclear stress test was performed using Bruce protocol. Patient reached 5.9 METS, and 91% of age predicted maximum heart rate. Exercise capacity was low. No chest pain reported. Normal heart rate response. Resting hypertension 140/80 mmHg, with hypertensive exercise response. peak BP 209/60 mmHg. Stress EKG revealed no ischemic changes. Normal myocardial perfusion. Stress LVEF 77%. Low risk study. Recommend hypertension management.  Carotid artery duplex 10/23/2021: Duplex suggests stenosis in the right internal carotid artery (50-69%). Duplex suggests stenosis in the left internal carotid artery (16-49%). Antegrade right vertebral artery flow. Antegrade left vertebral artery flow. Compared to the  study done on 04/25/2021, no significant change. Follow up in six months is appropriate if clinically indicated  EKG   EKG 10/25/2021: Normal sinus rhythm at rate of 53 bpm, left atrial enlargement, LVH with repolarization abnormality.  No significant change from 05/16/2021.  Assessment     ICD-10-CM   1. Essential hypertension  I10 EKG 12-Lead    valsartan-hydrochlorothiazide (DIOVAN-HCT) 160-12.5 MG tablet    2. Asymptomatic bilateral carotid artery stenosis  I65.23 pravastatin (PRAVACHOL) 40 MG tablet    PCV CAROTID DUPLEX (BILATERAL)    3. Hypercholesteremia  E78.00 pravastatin (PRAVACHOL) 40 MG tablet      Meds ordered this encounter  Medications   valsartan-hydrochlorothiazide (DIOVAN-HCT) 160-12.5 MG tablet    Sig: Take 1 tablet by mouth in the morning.    Dispense:  30 tablet    Refill:  2    Discontinue spironolactone due to hair loss   pravastatin (PRAVACHOL) 40 MG tablet    Sig: Take 1 tablet (40 mg total) by mouth every evening.    Dispense:  90 tablet    Refill:  3    Medications Discontinued During This Encounter  Medication Reason   meloxicam (MOBIC) 7.5 MG tablet    topiramate (TOPAMAX) 25 MG tablet    hydrochlorothiazide (MICROZIDE) 12.5 MG capsule Dose change   spironolactone (ALDACTONE) 25 MG tablet Side effect (s)   pravastatin (PRAVACHOL) 20 MG tablet Reorder    Recommendations:   Amanda Dean  is a 71 y.o.  with history of stroke, S/P ASD repair with 28-mm Starflex septal occluder closure of the ASD 05/05/2008, history of tobacco use disorder with COPD and quit smoking in 2015, hypertension, asymptomatic carotid artery stenosis and hyperlipidemia.   Patient presents for 79-monthfollow-up of carotid artery stenosis, hypertension, chronic dyspnea and hyperlipidemia.  Patient is presently doing well and has not had any chest pain or palpitation.  Dyspnea has remained stable.  I also reviewed her annual CT scan of the chest fortunately reveals no evidence  of coronary calcification.  Scarring in the left lung is evident and no significant change in the lung cancer screening from previous  years.  Blood pressure is elevated today, patient has gained about 5 to 6 pounds in weight since has been on medication.  She had discontinued spironolactone due to hair loss, but has started this back again as her blood pressure has been elevated.  Her goal blood pressure is 130/80 mmHg, will discontinue spironolactone due to hair loss and switch her to valsartan HCT 160/12.5 mg in the morning.  She will continue to monitor blood pressure and goal blood pressure is not reached, she will inform us at which point we can double up on the dose of the valsartan HCT to 360/25 mg in the morning.  Lipids also reviewed, LDL is slightly elevated previously well controlled, hence will increase pravastatin to 40 mg daily.  Carotid artery duplex reviewed with the patient, no significant change and has moderate disease on the right and mild disease on the left, will recheck in 6 months and see her back at that time.    Adrian Prows, MD, [employer] 10/25/2021, 3:59 PM Office: [phone] Fax: [phone] Pager: [phone]

## 2021-12-01 ENCOUNTER — Telehealth: Payer: Self-pay | Admitting: Cardiology

## 2021-12-01 ENCOUNTER — Other Ambulatory Visit: Payer: Self-pay

## 2021-12-01 MED ORDER — EZETIMIBE 10 MG PO TABS: 10.0000 mg | ORAL_TABLET | Freq: Every day | ORAL | 3 refills | Status: DC

## 2021-12-01 NOTE — Telephone Encounter (Signed)
Medication has been refilled.

## 2022-01-15 ENCOUNTER — Other Ambulatory Visit: Payer: Self-pay

## 2022-01-15 DIAGNOSIS — I10 Essential (primary) hypertension: Secondary | ICD-10-CM

## 2022-01-15 MED ORDER — VALSARTAN-HYDROCHLOROTHIAZIDE 160-12.5 MG PO TABS: 1.0000 | ORAL_TABLET | Freq: Every morning | ORAL | 3 refills | Status: DC

## 2022-04-03 ENCOUNTER — Other Ambulatory Visit: Payer: Self-pay | Admitting: Cardiology

## 2022-04-03 DIAGNOSIS — I10 Essential (primary) hypertension: Secondary | ICD-10-CM

## 2022-04-26 ENCOUNTER — Encounter: Payer: Self-pay | Admitting: Cardiology

## 2022-04-26 ENCOUNTER — Ambulatory Visit: Payer: Medicare Other | Admitting: Cardiology

## 2022-04-26 ENCOUNTER — Ambulatory Visit: Payer: Medicare Other

## 2022-04-26 VITALS — BP 131/70 | HR 62 | Resp 16 | Ht 62.0 in | Wt 189.0 lb

## 2022-04-26 DIAGNOSIS — I10 Essential (primary) hypertension: Secondary | ICD-10-CM

## 2022-04-26 DIAGNOSIS — R0609 Other forms of dyspnea: Secondary | ICD-10-CM

## 2022-04-26 DIAGNOSIS — I6523 Occlusion and stenosis of bilateral carotid arteries: Secondary | ICD-10-CM

## 2022-04-26 DIAGNOSIS — E559 Vitamin D deficiency, unspecified: Secondary | ICD-10-CM

## 2022-04-26 DIAGNOSIS — E7800 Pure hypercholesterolemia, unspecified: Secondary | ICD-10-CM

## 2022-04-26 MED ORDER — AMLODIPINE BESYLATE 5 MG PO TABS: 5.0000 mg | ORAL_TABLET | Freq: Every day | ORAL | 3 refills | Status: DC

## 2022-04-26 MED ORDER — EZETIMIBE 10 MG PO TABS: 10.0000 mg | ORAL_TABLET | Freq: Every day | ORAL | 3 refills | Status: AC

## 2022-04-26 MED ORDER — PRAVASTATIN SODIUM 40 MG PO TABS: 40.0000 mg | ORAL_TABLET | Freq: Every evening | ORAL | 3 refills | Status: DC

## 2022-04-26 MED ORDER — VALSARTAN-HYDROCHLOROTHIAZIDE 160-12.5 MG PO TABS: 1.0000 | ORAL_TABLET | Freq: Every morning | ORAL | 3 refills | Status: DC

## 2022-04-26 NOTE — Progress Notes (Signed)
Primary Physician/Referring:  [name], NP  Patient ID: Amanda Dean, female    DOB: 25-May-1950, 71 y.o.   MRN: [id number]  Chief Complaint  Patient presents with   Hypertension   Asymptomatic bilateral carotid artery stenosis   Follow-up    6 month   HPI: Amanda Dean  is a 71 y.o. female   with history of stroke, S/P ASD repair with 28-mm Starflex septal occluder closure of the ASD 05/05/2008, history of tobacco use disorder with COPD and quit smoking in 2015, hypertension, asymptomatic carotid artery stenosis and hyperlipidemia.   Patient presents for 42-monthfollow-up of carotid artery stenosis, hypertension, chronic dyspnea and hyperlipidemia.  Patient is presently doing well and has not had any chest pain or palpitation.  Dyspnea has remained stable.  On her last office visit I did increase the dose of pravastatin which she is tolerating.  I had also increased the dose of valsartan HCT which she is tolerating and states that her blood pressure is under excellent control.  Past Medical History:  Diagnosis Date   ASD (atrial septal defect)    PFO no significant shunt   COPD (chronic obstructive pulmonary disease) (HCC)    CVA (cerebral infarction)    Lt MCA , 2001 failed cerebral angioplasty x 2   Hyperlipidemia    Hypoxemia    LVH (left ventricular hypertrophy)    Tobacco abuse    Past Surgical History:  Procedure Laterality Date   ASD REPAIR  05/05/2008   Dr. GOphelia Shoulder246mdevice   CARDIAC CATHETERIZATION  12/11/2007   10-20% LUMINAL IRREGULARITY MID LAD, otherwise normal coronary arteries   Cerebral angioplasty     NM MYOVIEW LTD  01/12/2011   no ischemia   [place]GRAPHY  10/25/2011   mod. concentric  LVH,EF 60-65%,trace MR   Family History  Problem Relation Age of Onset   Rheumatic fever Mother    Prostate cancer Father    Social History   Tobacco Use   Smoking status: Former    Packs/day: 1.00    Years: 35.00    Total pack years: 35.00    Types:  Cigarettes    Quit date: 06/17/2013    Years since quitting: 8.8   Smokeless tobacco: Never  Substance Use Topics   Alcohol use: Yes    Alcohol/week: 6.0 standard drinks of alcohol    Types: 3 Glasses of wine, 3 Cans of beer per week    Comment: rarely   Marital Status: Single   ROS   Review of Systems  Cardiovascular:  Positive for dyspnea on exertion. Negative for chest pain and leg swelling.   Objective  Blood pressure 131/70, pulse 62, resp. rate 16, height _0  (1.575 m), weight 189 lb (85.7 kg), SpO2 93 %. Body mass index is 34.57 kg/m.    04/26/2022   11:48 AM 10/25/2021    3:47 PM 10/25/2021    2:40 PM  Vitals with BMI  Height _1   _2   Weight 189 lbs  181 lbs 3 oz  BMI 3449.173391.50Systolic [id number] 70 80 75  Pulse 62  73   Physical Exam Vitals reviewed.  Constitutional:      General: She is not in acute distress.    Appearance: She is well-developed.     Comments: Mildly obese  Neck:     Vascular: No carotid bruit or JVD.  Cardiovascular:     Rate and Rhythm: Normal rate and  regular rhythm.     Pulses: Intact distal pulses.          Dorsalis pedis pulses are 1+ on the right side and 1+ on the left side.       Posterior tibial pulses are 2+ on the right side and 2+ on the left side.     Heart sounds: S1 normal and S2 normal. Murmur heard.     Early systolic murmur is present with a grade of 2/6 at the upper right sternal border.     No gallop.  Pulmonary:     Effort: Pulmonary effort is normal.     Breath sounds: Normal breath sounds.  Musculoskeletal:     Right lower leg: No edema.     Left lower leg: No edema.    Laboratory examination:   External labs:   Labs 05/16/2021:  Sodium 140, potassium 4.4, serum glucose 101 mg, BUN 21, creatinine 0.80, EGFR 79 mL, LFTs normal.  Total cholesterol 163, triglycerides 94, HDL 62, LDL 77.  TSH normal at 2.08.  Vitamin D normal at 30.0.  A1c 6.3%.  Hb 13.5/HCT 39.9, platelets 242,  normal indicis.  Allergies   Allergies  Allergen Reactions   Spironolactone Other (See Comments)    Hair loss   Crestor [Rosuvastatin] Other (See Comments)    Memory loss     Final Medications at End of Visit    Current Outpatient Medications:    aspirin 81 MG tablet, Take 81 mg by mouth daily., Disp: , Rfl:    Calcium-Vitamin D-Vitamin K 161-0960-45 MG-UNT-MCG TABS, Take 1 tablet by mouth in the morning and at bedtime., Disp: , Rfl:    cetirizine (ZYRTEC) 10 MG tablet, Take 1 tablet by mouth daily., Disp: , Rfl:    fluticasone (FLONASE) 50 MCG/ACT nasal spray, Place 1-2 sprays into the nose as needed., Disp: , Rfl:    guaiFENesin (MUCINEX) 600 MG 12 hr tablet, Take 1 tablet by mouth daily., Disp: , Rfl:    solifenacin (VESICARE) 5 MG tablet, Take 2 tablets by mouth daily., Disp: , Rfl:    amLODipine (NORVASC) 5 MG tablet, Take 1 tablet (5 mg total) by mouth daily., Disp: 90 tablet, Rfl: 3   ezetimibe (ZETIA) 10 MG tablet, Take 1 tablet (10 mg total) by mouth daily., Disp: 90 tablet, Rfl: 3   pravastatin (PRAVACHOL) 40 MG tablet, Take 1 tablet (40 mg total) by mouth every evening., Disp: 90 tablet, Rfl: 3   valsartan-hydrochlorothiazide (DIOVAN-HCT) 160-12.5 MG tablet, Take 1 tablet by mouth every morning., Disp: 90 tablet, Rfl: 3    Radiology:   CT scan for lung cancer screening 05/23/2021: LUNGS: Patchy nodular density left lower lobe adjacent to the fissure  likely reflects chronic scarring with mild thickening of the bronchi within the left lower lobe. Stable solid nodule within the right lower lobe medially 7 mm.  PLEURA: No pleural thickening or effusion.  HEART: Heart size normal. No pericardial effusion.  CORONARY ARTERY CALCIFICATION: None Cardiac Studies:   Echocardiogram 10/20/2020: Normal LV systolic function with EF 55%. Left ventricle cavity is normal in size. Moderate concentric hypertrophy of the left ventricle. Normal global wall motion. Doppler evidence of grade  II (pseudonormal) diastolic dysfunction, elevated LAP. Calculated EF 55%. Left atrial cavity is moderately dilated by volume. Structurally normal mitral valve.  Mild (Grade I) mitral regurgitation. Structurally normal tricuspid valve.  Mild tricuspid regurgitation. No evidence of pulmonary hypertension. Compared to 40/98/1191, grade I diastolic dysfunction is now grade II.  PCV MYOCARDIAL PERFUSION WO LEXISCAN 06/21/2021 Exercise nuclear stress test was performed using Bruce protocol. Patient reached 5.9 METS, and 91% of age predicted maximum heart rate. Exercise capacity was low. No chest pain reported. Normal heart rate response. Resting hypertension 140/80 mmHg, with hypertensive exercise response. peak BP 209/60 mmHg. Stress EKG revealed no ischemic changes. Normal myocardial perfusion. Stress LVEF 77%. Low risk study. Recommend hypertension management.  Carotid artery duplex 04/26/2022:  The bifurcation, internal, external and common carotid arteries reveal no evidence of significant stenosis, bilaterally. There is mild to moderate diffuse heterogenous plaque both in the common carotid and carotid artery bulb bilaterally. Antegrade right vertebral artery flow. Antegrade left vertebral artery flow. Compared to the study done on 10/25/2021, there is regression of carotid artery stenosis bilaterally.  Right ICA has shown a stenosis of 50 to 69% and left 16 to 49%. Follow up studies when clinically indicated.  EKG   EKG 04/26/2022: Normal sinus rhythm with rate of 62 bpm, left atrial enlargement, normal axis.  Incomplete right bundle branch block.  Cannot exclude high lateral infarct old.  LVH.  Compared to 10/25/2021, no significant change.   Assessment     ICD-10-CM   1. Asymptomatic bilateral carotid artery stenosis  I65.23 EKG 12-Lead    pravastatin (PRAVACHOL) 40 MG tablet    PCV CAROTID DUPLEX (BILATERAL)    2. Essential hypertension  I10 valsartan-hydrochlorothiazide (DIOVAN-HCT)  160-12.5 MG tablet    CMP14+EGFR    CBC    3. Hypercholesteremia  E78.00 pravastatin (PRAVACHOL) 40 MG tablet    Lipid Panel With LDL/HDL Ratio    Lipoprotein A (LPA)    4. Dyspnea on exertion  R06.09     5. Hypovitaminosis D  E55.9 VITAMIN D 25 Hydroxy (Vit-D Deficiency, Fractures)      Meds ordered this encounter  Medications   amLODipine (NORVASC) 5 MG tablet    Sig: Take 1 tablet (5 mg total) by mouth daily.    Dispense:  90 tablet    Refill:  3   ezetimibe (ZETIA) 10 MG tablet    Sig: Take 1 tablet (10 mg total) by mouth daily.    Dispense:  90 tablet    Refill:  3   pravastatin (PRAVACHOL) 40 MG tablet    Sig: Take 1 tablet (40 mg total) by mouth every evening.    Dispense:  90 tablet    Refill:  3   valsartan-hydrochlorothiazide (DIOVAN-HCT) 160-12.5 MG tablet    Sig: Take 1 tablet by mouth every morning.    Dispense:  90 tablet    Refill:  3   Medications Discontinued During This Encounter  Medication Reason   amLODipine (NORVASC) 5 MG tablet Reorder   pravastatin (PRAVACHOL) 40 MG tablet Reorder   ezetimibe (ZETIA) 10 MG tablet Reorder   valsartan-hydrochlorothiazide (DIOVAN-HCT) 160-12.5 MG tablet Reorder   Recommendations:   Aine Strycharz  is a 71 y.o.  with history of stroke, S/P ASD repair with 28-mm Starflex septal occluder closure of the ASD 05/05/2008, history of tobacco use disorder with COPD and quit smoking in 2015, hypertension, asymptomatic carotid artery stenosis and hyperlipidemia.   1. Asymptomatic bilateral carotid artery stenosis Patient with bilateral moderate carotid stenosis, on her last office visit I will increase the dose of the pravastatin which she is well tolerating now and also presently on Zetia, review of the carotid duplex reveals improvement in stenosis severity.  I do not hear any bruit further.  However in view of significant discrepancy  between today's study and prior study which had revealed 50 to 69% stenosis on the right, awake  repeat carotid artery duplex 1 more time and if there is no change she will not need any further surveillance.  She will continue aspirin indefinitely.  2. Essential hypertension Blood pressure is very well-controlled on ARB and diuretic combination along with amlodipine.  Continue the same.  3. Hypercholesteremia Lipids under excellent control, will recheck lipid profile testing.  4. Dyspnea on exertion Dyspnea on exertion has remained stable, this is from prior smoking and COPD.  No clinical evidence of heart failure, no JVD or leg edema.  5. Hypovitaminosis D Presently on calcium and vitamin supplements due to osteoporosis, prior vitamin D level was in the lower limit of normal at 30, will repeat levels and I will forward copy of the labs to her PCP as well.  I will see her back in a year with repeat carotid artery duplex and if stable, I will make her visits as needed.    Adrian Prows, MD, [facility] 04/26/2022, 12:21 PM Office: [phone] Fax: [phone] Pager: [phone]

## 2022-04-28 LAB — CMP14+EGFR
ALT: 22 IU/L (ref 0–32)
AST: 21 IU/L (ref 0–40)
Albumin/Globulin Ratio: 2.2 (ref 1.2–2.2)
Albumin: 4.6 g/dL (ref 3.8–4.8)
Alkaline Phosphatase: 50 IU/L (ref 44–121)
BUN/Creatinine Ratio: 23 (ref 12–28)
BUN: 19 mg/dL (ref 8–27)
Bilirubin Total: 0.2 mg/dL (ref 0.0–1.2)
CO2: 24 mmol/L (ref 20–29)
Calcium: 9.2 mg/dL (ref 8.7–10.3)
Chloride: 105 mmol/L (ref 96–106)
Creatinine, Ser: 0.81 mg/dL (ref 0.57–1.00)
Globulin, Total: 2.1 g/dL (ref 1.5–4.5)
Glucose: 105 mg/dL — ABNORMAL HIGH (ref 70–99)
Potassium: 4.7 mmol/L (ref 3.5–5.2)
Sodium: 143 mmol/L (ref 134–144)
Total Protein: 6.7 g/dL (ref 6.0–8.5)
eGFR: 77 mL/min/{1.73_m2} (ref >59)

## 2022-04-28 LAB — CBC
Hematocrit: 36.8 % (ref 34.0–46.6)
Hemoglobin: 12.3 g/dL (ref 11.1–15.9)
MCH: 32.2 pg (ref 26.6–33.0)
MCHC: 33.4 g/dL (ref 31.5–35.7)
MCV: 96 fL (ref 79–97)
Platelets: 234 10*3/uL (ref 150–450)
RBC: 3.82 x10E6/uL (ref 3.77–5.28)
RDW: 11.5 % — ABNORMAL LOW (ref 11.7–15.4)
WBC: 5.6 10*3/uL (ref 3.4–10.8)

## 2022-04-28 LAB — LIPID PANEL WITH LDL/HDL RATIO
Cholesterol, Total: 147 mg/dL (ref 100–199)
HDL: 79 mg/dL (ref >39)
LDL Chol Calc (NIH): 55 mg/dL (ref 0–99)
LDL/HDL Ratio: 0.7 ratio (ref 0.0–3.2)
Triglycerides: 61 mg/dL (ref 0–149)
VLDL Cholesterol Cal: 13 mg/dL (ref 5–40)

## 2022-04-28 LAB — LIPOPROTEIN A (LPA): Lipoprotein (a): 30.2 nmol/L (ref <75.0)

## 2022-04-28 LAB — VITAMIN D 25 HYDROXY (VIT D DEFICIENCY, FRACTURES): Vit D, 25-Hydroxy: 35.3 ng/mL (ref 30.0–100.0)

## 2022-06-04 ENCOUNTER — Ambulatory Visit
Admission: RE | Admit: 2022-06-04 | Discharge: 2022-06-04 | Disposition: A | Discharge status: Home / Self Care | Payer: Medicare Other | Source: Ambulatory Visit | Attending: Family Medicine | Admitting: Family Medicine

## 2022-06-04 VITALS — BP 131/76 | HR 93 | Temp 98.3°F | Resp 20

## 2022-06-04 DIAGNOSIS — M940 Chondrocostal junction syndrome [Tietze]: Secondary | ICD-10-CM

## 2022-06-04 NOTE — Discharge Instructions (Signed)
Put ice on your sternum. To do this: Put ice in a plastic bag. Place a towel between your skin and the bag. Leave the ice on for 20 minutes, 2-3 times a day. May take Ibuprofen 200mg , 4 tabs every 8 hours with food for 3 to 5 days.  If symptoms become significantly worse during the night or over the weekend, proceed to the local emergency room.

## 2022-06-04 NOTE — ED Provider Notes (Signed)
Amanda Dean [facility]    CSN: [id number] Arrival date & time: 06/04/22  1451      History   Chief Complaint Chief Complaint  Patient presents with   Fatigue   Chest Pain   Shortness of Breath    HPI Ashland Osmer is a 71 y.o. female.   Patient complains of fatigue, shortness of breath with activity, and intermittent anterior chest discomfort worse with inspiration for about 3 weeks.  She feels like her chest is tight and she has difficulty fully expanding her chest.  She denies fevers, chills, and sweats, and feels well otherwise.  She has a history of COPD with stable screening CT chest 11 days ago. She recalls having had a cold-like URI about a month ago with resolution of symptoms.   The history is provided by the patient and a relative.  Chest Pain Pain location:  Substernal area Pain quality: dull and tightness   Pain radiates to:  Does not radiate Pain severity:  Mild Onset quality:  Gradual Duration:  3 weeks Timing:  Intermittent Progression:  Unchanged Chronicity:  New Context: breathing   Relieved by:  Nothing Worsened by:  Coughing and deep breathing Ineffective treatments:  None tried Associated symptoms: fatigue and shortness of breath   Associated symptoms: no AICD problem, no cough, no diaphoresis and no fever     Past Medical History:  Diagnosis Date   ASD (atrial septal defect)    PFO no significant shunt   COPD (chronic obstructive pulmonary disease) (HCC)    CVA (cerebral infarction)    Lt MCA , 2001 failed cerebral angioplasty x 2   Hyperlipidemia    Hypoxemia    LVH (left ventricular hypertrophy)    Tobacco abuse     Patient Active Problem List   Diagnosis Date Noted   Status post device closure of ASD 11/23/2012   HYPERLIPIDEMIA 11/27/2007   TOBACCO ABUSE 11/27/2007   CVA 11/27/2007   C O P D 11/27/2007   HYPOXEMIA HYPOXIA HYPOXIC 11/27/2007    Past Surgical History:  Procedure Laterality Date   ASD REPAIR  05/05/2008   Dr.  Ophelia Shoulder 69mm device   CARDIAC CATHETERIZATION  12/11/2007   10-20% LUMINAL IRREGULARITY MID LAD, otherwise normal coronary arteries   Cerebral angioplasty     NM MYOVIEW LTD  01/12/2011   no ischemia   US ECHOCARDIOGRAPHY  10/25/2011   mod. concentric  LVH,EF 60-65%,trace MR    OB History   No obstetric history on file.      Home Medications    Prior to Admission medications   Medication Sig Start Date End Date Taking? Authorizing Provider  amLODipine (NORVASC) 5 MG tablet Take 1 tablet (5 mg total) by mouth daily. 04/26/22   Adrian Prows, MD  aspirin 81 MG tablet Take 81 mg by mouth daily.    [provider]  Calcium-Vitamin D-Vitamin K 929 697 0319-90 MG-UNT-MCG TABS Take 1 tablet by mouth in the morning and at bedtime.    [provider]  cetirizine (ZYRTEC) 10 MG tablet Take 1 tablet by mouth daily.    [provider]  ezetimibe (ZETIA) 10 MG tablet Take 1 tablet (10 mg total) by mouth daily. 04/26/22 04/21/23  Adrian Prows, MD  fluticasone (FLONASE) 50 MCG/ACT nasal spray Place 1-2 sprays into the nose as needed.    [provider]  guaiFENesin (MUCINEX) 600 MG 12 hr tablet Take 1 tablet by mouth daily.    [provider]  pravastatin (PRAVACHOL) 40  MG tablet Take 1 tablet (40 mg total) by mouth every evening. 04/26/22   Adrian Prows, MD  solifenacin (VESICARE) 5 MG tablet Take 2 tablets by mouth daily. 04/18/20   [provider]  valsartan-hydrochlorothiazide (DIOVAN-HCT) 160-12.5 MG tablet Take 1 tablet by mouth every morning. 04/26/22   Adrian Prows, MD    Family History Family History  Problem Relation Age of Onset   Rheumatic fever Mother    Prostate cancer Father     Social History Social History   Tobacco Use   Smoking status: Former    Packs/day: 1.00    Years: 35.00    Total pack years: 35.00    Types: Cigarettes    Quit date: 06/17/2013    Years since quitting: 8.9   Smokeless tobacco: Never  Vaping Use    Vaping Use: Never used  Substance Use Topics   Alcohol use: Not Currently    Comment: occasionally   Drug use: No     Allergies   Spironolactone and Crestor [rosuvastatin]   Review of Systems Review of Systems  Constitutional:  Positive for fatigue. Negative for appetite change, chills, diaphoresis and fever.  HENT: Negative.    Eyes: Negative.   Respiratory:  Positive for chest tightness and shortness of breath. Negative for cough, wheezing and stridor.   Cardiovascular:  Positive for chest pain.  Gastrointestinal: Negative.   Genitourinary: Negative.   Musculoskeletal: Negative.   Neurological: Negative.   Hematological:  Negative for adenopathy.     Physical Exam Triage Vital Signs ED Triage Vitals  Enc Vitals Group     BP 06/04/22 1509 131/76     Pulse Rate 06/04/22 1507 93     Resp 06/04/22 1507 20     Temp 06/04/22 1507 98.3 F (36.8 C)     Temp Source 06/04/22 1507 Oral     SpO2 06/04/22 1507 95 %     Weight --      Height --      Head Circumference --      Peak Flow --      Pain Score 06/04/22 1505 0     Pain Loc --      Pain Edu? --      Excl. in [place]? --    No data found.  Updated Vital Signs BP 131/76 (BP Location: Left Arm)   Pulse 93   Temp 98.3 F (36.8 C) (Oral)   Resp 20   SpO2 95%   Visual Acuity Right Eye Distance:   Left Eye Distance:   Bilateral Distance:    Right Eye Near:   Left Eye Near:    Bilateral Near:     Physical Exam Vitals and nursing note reviewed.  Constitutional:      General: She is not in acute distress. HENT:     Head: Normocephalic.     Nose: Nose normal.     Mouth/Throat:     Mouth: Mucous membranes are moist.  Eyes:     Conjunctiva/sclera: Conjunctivae normal.     Pupils: Pupils are equal, round, and reactive to light.  Cardiovascular:     Rate and Rhythm: Normal rate and regular rhythm.     Heart sounds: Normal heart sounds.  Pulmonary:     Breath sounds: Normal breath sounds.  Chest:     Chest  wall: Tenderness present.       Comments: Chest:  Distinct tenderness to palpation over the upper and mid-sternum as noted on diagram.  Abdominal:     Palpations: Abdomen is soft.     Tenderness: There is no abdominal tenderness.  Musculoskeletal:        General: No tenderness.     Cervical back: Neck supple.  Lymphadenopathy:     Cervical: No cervical adenopathy.  Skin:    General: Skin is warm and dry.     Findings: No rash.  Neurological:     Mental Status: She is alert and oriented to person, place, and time.      UC Treatments / Results  Labs (all labs ordered are listed, but only abnormal results are displayed) Labs Reviewed - No data to display  EKG  Rate:   82 BPM PR:   144 msec QT:  400 msec QTcH:  467 msec QRSD:  96 msec QRS axis:  -1 degrees Interpretation: LVH; possible left atrial enlargement; non-specific ST and T wave abnormalities; normal sinus rhythm   Radiology No results found.  Procedures Procedures (including critical care time)  Medications Ordered in UC Medications - No data to display  Initial Impression / Assessment and Plan / UC Course  I have reviewed the triage vital signs and the nursing notes.  Pertinent labs & imaging results that were available during my care of the patient were reviewed by me and considered in my medical decision making (see chart for details).    Will defer chest x-ray.  Patient had stable screening CT chest eleven days ago.  Today's EKG shows no acute changes and has no significant change from EKG done 03/27/22. Reassurance. Followup with Family Doctor if not improved in about one month.  Final Clinical Impressions(s) / UC Diagnoses   Final diagnoses:  Costochondritis     Discharge Instructions       Put ice on your sternum. To do this: Put ice in a plastic bag. Place a towel between your skin and the bag. Leave the ice on for 20 minutes, 2-3 times a day. May take Ibuprofen 200mg , 4 tabs every 8  hours with food for 3 to 5 days.  If symptoms become significantly worse during the night or over the weekend, proceed to the local emergency room.     ED Prescriptions   None       Kandra Nicolas, MD 06/05/22 2010

## 2022-06-04 NOTE — ED Triage Notes (Addendum)
Pt presents to Urgent Care with c/o fatigue and sob x approx 3 weeks and intermittent chest "discomfort, not pain" to both sides of chest x 10 days. Reports pulse ox in the high 80s.

## 2022-07-10 ENCOUNTER — Other Ambulatory Visit: Payer: Self-pay

## 2022-07-10 DIAGNOSIS — I10 Essential (primary) hypertension: Secondary | ICD-10-CM

## 2022-07-10 MED ORDER — AMLODIPINE BESYLATE 5 MG PO TABS: 5.0000 mg | ORAL_TABLET | Freq: Every day | ORAL | 3 refills | Status: AC

## 2022-07-10 MED ORDER — VALSARTAN-HYDROCHLOROTHIAZIDE 160-12.5 MG PO TABS: 1.0000 | ORAL_TABLET | Freq: Every morning | ORAL | 3 refills | Status: DC

## 2022-09-10 ENCOUNTER — Other Ambulatory Visit: Payer: Self-pay | Admitting: Cardiology

## 2022-09-10 DIAGNOSIS — E7800 Pure hypercholesterolemia, unspecified: Secondary | ICD-10-CM

## 2022-09-10 DIAGNOSIS — I6523 Occlusion and stenosis of bilateral carotid arteries: Secondary | ICD-10-CM

## 2022-11-23 ENCOUNTER — Ambulatory Visit: Payer: Medicare Other | Admitting: Cardiology

## 2022-12-20 ENCOUNTER — Ambulatory Visit: Payer: Medicare Other | Admitting: Cardiology

## 2022-12-21 ENCOUNTER — Ambulatory Visit: Payer: Medicare Other | Admitting: Cardiology

## 2022-12-21 ENCOUNTER — Encounter: Payer: Self-pay | Admitting: Cardiology

## 2022-12-21 VITALS — BP 115/76 | HR 88 | Resp 16 | Ht 62.0 in | Wt 187.0 lb

## 2022-12-21 DIAGNOSIS — I6523 Occlusion and stenosis of bilateral carotid arteries: Secondary | ICD-10-CM

## 2022-12-21 DIAGNOSIS — R5381 Other malaise: Secondary | ICD-10-CM

## 2022-12-21 DIAGNOSIS — I10 Essential (primary) hypertension: Secondary | ICD-10-CM

## 2022-12-21 DIAGNOSIS — E7800 Pure hypercholesterolemia, unspecified: Secondary | ICD-10-CM

## 2022-12-21 NOTE — Progress Notes (Signed)
Primary Physician/Referring:  Loyola Mast, NP  Patient ID: Amanda Dean, female    DOB: 27-Jan-1951, 72 y.o.   MRN: [id number]  Chief Complaint  Patient presents with   Hypertension   Asympotomatic carotid stenosis   Follow-up   HPI: Amanda Dean  is a 72 y.o. female   with history of stroke, S/P ASD repair with 28-mm Starflex septal occluder closure of the ASD 05/05/2008, history of tobacco use disorder with COPD and quit smoking in 2015, hypertension, asymptomatic carotid artery stenosis and hyperlipidemia.   This is a sick visit, patient states that she has noticed sudden onset of malaise and fatigue and does not occur on a daily basis but most days, suddenly she has to stop after exerting herself and has to rest.  Denies chest pain or palpitations or dizziness.  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  Symptoms started a month ago.  No other associated symptoms.  She clearly denies dyspnea that is out of proportion to her baseline dyspnea.  No change in weight.  No change in recent medications.  Past Medical History:  Diagnosis Date   ASD (atrial septal defect)    PFO no significant shunt   COPD (chronic obstructive pulmonary disease) (HCC)    CVA (cerebral infarction)    Lt MCA , 2001 failed cerebral angioplasty x 2   Hyperlipidemia    Hypoxemia    LVH (left ventricular hypertrophy)    Tobacco abuse    Past Surgical History:  Procedure Laterality Date   ASD REPAIR  05/05/2008   Dr. Michell Heinrich 28mm device   CARDIAC CATHETERIZATION  12/11/2007   10-20% LUMINAL IRREGULARITY MID LAD, otherwise normal coronary arteries   Cerebral angioplasty     NM MYOVIEW LTD  01/12/2011   no ischemia   US ECHOCARDIOGRAPHY  10/25/2011   mod. concentric  LVH,EF 60-65%,trace MR   Family History  Problem Relation Age of Onset   Rheumatic fever Mother    Prostate cancer Father    Social History   Tobacco Use   Smoking status: Former    Current packs/day: 0.00    Average packs/day: 1 pack/day for 35.0  years (35.0 ttl pk-yrs)    Types: Cigarettes    Start date: 06/17/1978    Quit date: 06/17/2013    Years since quitting: 9.5   Smokeless tobacco: Never  Substance Use Topics   Alcohol use: Not Currently    Comment: occasionally   Marital Status: Single   ROS   Review of Systems  Constitutional: Positive for malaise/fatigue.  Cardiovascular:  Positive for dyspnea on exertion. Negative for chest pain and leg swelling.   Objective  Blood pressure 115/76, pulse 88, resp. rate 16, height 5\' 2"  (1.575 m), weight 187 lb (84.8 kg), SpO2 97%. Body mass index is 34.2 kg/m.    12/21/2022    2:26 PM 06/04/2022    3:09 PM 06/04/2022    3:07 PM  Vitals with BMI  Height 5\' 2"     Weight 187 lbs    BMI 34.19    Systolic 115 131   Diastolic 76 76   Pulse 88  93   Physical Exam Vitals reviewed.  Constitutional:      General: She is not in acute distress.    Appearance: She is well-developed.     Comments: Mildly obese  Neck:     Vascular: No carotid bruit or JVD.  Cardiovascular:     Rate and Rhythm: Normal rate and regular rhythm.  Pulses: Intact distal pulses.          Dorsalis pedis pulses are 1+ on the right side and 1+ on the left side.       Posterior tibial pulses are 2+ on the right side and 2+ on the left side.     Heart sounds: S1 normal and S2 normal. Murmur heard.     Early systolic murmur is present with a grade of 2/6 at the upper right sternal border.     No gallop.  Pulmonary:     Effort: Pulmonary effort is normal.     Breath sounds: Examination of the left-upper field reveals rhonchi. Examination of the left-middle field reveals rhonchi. Rhonchi present.  Musculoskeletal:     Right lower leg: No edema.     Left lower leg: No edema.    Laboratory examination:   External labs:   Labs 11/09/2022:  Sodium 140, potassium 4.5, BUN 19, creatinine 0.76, EGFR 83 mL, LFTs normal.  A1c 6.7%.  TSH normal at 2.131.  Total cholesterol 145, triglycerides 84, HDL 63, LDL  66.  Non-HDL cholesterol 82.  Hb 12.3/HCT 35.7, platelets 232, normal indicis.  Radiology:   CT scan for lung cancer screening 05/24/2022: LUNGS: Patchy nodular density left lower lobe adjacent to the fissure  likely reflects chronic scarring with mild thickening of the bronchi within the left lower lobe. Stable solid nodule within the right lower lobe medially 6-7 mm.  PLEURA: No pleural thickening or effusion.  HEART: Heart size normal. No pericardial effusion.  Mild aortic valve calcification, surgical changes Lp(a) history of nausea.  Normal aortic size. CORONARY ARTERY CALCIFICATION: None No change since prior scan a year ago.  Cardiac Studies:   Echocardiogram 10/20/2020: Normal LV systolic function with EF 55%. Left ventricle cavity is normal in size. Moderate concentric hypertrophy of the left ventricle. Normal global wall motion. Doppler evidence of grade II (pseudonormal) diastolic dysfunction, elevated LAP. Calculated EF 55%. Left atrial cavity is moderately dilated by volume. Structurally normal mitral valve.  Mild (Grade I) mitral regurgitation. Structurally normal tricuspid valve.  Mild tricuspid regurgitation. No evidence of pulmonary hypertension. Compared to 04/03/2019, grade I diastolic dysfunction is now grade II.  PCV MYOCARDIAL PERFUSION WO LEXISCAN 06/21/2021 Exercise nuclear stress test was performed using Bruce protocol. Patient reached 5.9 METS, and 91% of age predicted maximum heart rate. Exercise capacity was low. No chest pain reported. Normal heart rate response. Resting hypertension 140/80 mmHg, with hypertensive exercise response. peak BP 209/60 mmHg. Stress EKG revealed no ischemic changes. Normal myocardial perfusion. Stress LVEF 77%. Low risk study. Recommend hypertension management.  Carotid artery duplex 04/26/2022:  The bifurcation, internal, external and common carotid arteries reveal no evidence of significant stenosis, bilaterally. There is mild to  moderate diffuse heterogenous plaque both in the common carotid and carotid artery bulb bilaterally. Antegrade right vertebral artery flow. Antegrade left vertebral artery flow. Compared to the study done on 10/25/2021, there is regression of carotid artery stenosis bilaterally.  Right ICA has shown a stenosis of 50 to 69% and left 16 to 49%. Follow up studies when clinically indicated.  EKG   EKG 12/21/2022: Normal sinus rhythm at rate of 57 bpm, left atrial enlargement, normal axis.  LVH.  Incomplete right bundle branch block.  Cannot exclude septal infarct old.  Compared to 04/26/2022, no significant change.  Allergies   Allergies  Allergen Reactions   Spironolactone Other (See Comments)    Hair loss   Crestor [Rosuvastatin] Other (See Comments)  Memory loss     Current Outpatient Medications:    metFORMIN (GLUCOPHAGE-XR) 500 MG 24 hr tablet, Take 1 tablet by mouth daily with breakfast., Disp: , Rfl:    MOUNJARO 2.5 MG/0.5ML Pen, Inject 2.5 mg into the skin once a week., Disp: , Rfl:    amLODipine (NORVASC) 5 MG tablet, Take 1 tablet (5 mg total) by mouth daily., Disp: 90 tablet, Rfl: 3   aspirin 81 MG tablet, Take 81 mg by mouth daily., Disp: , Rfl:    Calcium-Vitamin D-Vitamin K [id number] MG-UNT-MCG TABS, Take 1 tablet by mouth in the morning and at bedtime., Disp: , Rfl:    cetirizine (ZYRTEC) 10 MG tablet, Take 1 tablet by mouth daily., Disp: , Rfl:    ezetimibe (ZETIA) 10 MG tablet, Take 1 tablet (10 mg total) by mouth daily., Disp: 90 tablet, Rfl: 3   fluticasone (FLONASE) 50 MCG/ACT nasal spray, Place 1-2 sprays into the nose as needed., Disp: , Rfl:    guaiFENesin (MUCINEX) 600 MG 12 hr tablet, Take 1 tablet by mouth daily., Disp: , Rfl:    pravastatin (PRAVACHOL) 40 MG tablet, TAKE 1 TABLET EVERY EVENING, Disp: 90 tablet, Rfl: 3   solifenacin (VESICARE) 5 MG tablet, Take 2 tablets by mouth daily., Disp: , Rfl:    valsartan-hydrochlorothiazide (DIOVAN-HCT) 160-12.5 MG  tablet, Take 1 tablet by mouth every morning., Disp: 90 tablet, Rfl: 3   Assessment     ICD-10-CM   1. Malaise and fatigue  R53.81    R53.83     2. Essential hypertension  I10     3. Hypercholesteremia  E78.00     4. Asymptomatic bilateral carotid artery stenosis  I65.23 EKG 12-Lead      No orders of the defined types were placed in this encounter.  There are no discontinued medications.  Recommendations:   Amanda Dean  is a 72 y.o.  with history of stroke, S/P ASD repair with 28-mm Starflex septal occluder closure of the ASD 05/05/2008, history of tobacco use disorder with COPD and quit smoking in 2015, hypertension, asymptomatic carotid artery stenosis and hyperlipidemia.   1. Malaise and fatigue This is a sick visit, patient states that she has noticed sudden onset of malaise and fatigue and does not occur on a daily basis but most days, suddenly she has to stop after exerting herself and has to rest.  Denies dyspnea that is any different from her baseline minimal dyspnea on exertion or chest pain or palpitations or dizziness.  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  I have advised her to go for a walk with her daughter so she can watch her.  As her physical examination, blood pressure, EKG, is unchanged or normal, labs reviewed, excellent control of lipids, she has no suggestion of sleep apnea, I have recommended that we watchfully wait and see, symptoms only started a month ago.  I reassured her.  I also reviewed her CT scan of the chest that was done for lung cancer screening, for the past 2 years scarred left lung has remained stable, no change in physical exam.  Patient denies dyspnea, PND or orthopnea and there is no clinical evidence of heart failure.  2. Essential hypertension Blood pressure is very well-controlled on amlodipine and valsartan HCT, continue the same.  3. Hypercholesteremia Lipids under excellent control with a combination of Zetia and pravastatin 40 mg, continue the  same.  If her fatigue persists, could consider statin holiday.  4. Asymptomatic bilateral carotid artery stenosis  She has mild to moderate diffuse atherosclerotic changes in the carotid artery, we are continuing surveillance duplex.  No suggestion of any TIA or neurologic deficits.  I will see her back as previously scheduled in December. - EKG 12-Lead  Other orders - MOUNJARO 2.5 MG/0.5ML Pen; Inject 2.5 mg into the skin once a week. - metFORMIN (GLUCOPHAGE-XR) 500 MG 24 hr tablet; Take 1 tablet by mouth daily with breakfast.   1. Asymptomatic bilateral carotid artery stenosis Patient with bilateral moderate carotid stenosis, on her last office visit I will increase the dose of the pravastatin which she is well tolerating now and also presently on Zetia, review of the carotid duplex reveals improvement in stenosis severity.  I do not hear any bruit further.  However in view of significant discrepancy between today's study and prior study which had revealed 50 to 69% stenosis on the right, awake repeat carotid artery duplex 1 more time and if there is no change she will not need any further surveillance.  She will continue aspirin indefinitely.  2. Essential hypertension Blood pressure is very well-controlled on ARB and diuretic combination along with amlodipine.  Continue the same.  3. Hypercholesteremia Lipids under excellent control, will recheck lipid profile testing.  4. Dyspnea on exertion Dyspnea on exertion has remained stable, this is from prior smoking and COPD.  No clinical evidence of heart failure, no JVD or leg edema.  5. Hypovitaminosis D Presently on calcium and vitamin supplements due to osteoporosis, prior vitamin D level was in the lower limit of normal at 30, will repeat levels and I will forward copy of the labs to her PCP as well.  I will see her back in a year with repeat carotid artery duplex and if stable, I will make her visits as needed.    Amanda Decamp, MD,  [facility] 12/21/2022, 2:57 PM Office: [phone] Fax: [phone] Pager: [phone]

## 2023-04-02 ENCOUNTER — Ambulatory Visit (HOSPITAL_COMMUNITY): Payer: Medicare Other

## 2023-04-02 ENCOUNTER — Ambulatory Visit: Payer: Self-pay | Admitting: Cardiology

## 2023-04-02 ENCOUNTER — Other Ambulatory Visit: Payer: Medicare Other

## 2023-04-18 ENCOUNTER — Ambulatory Visit (HOSPITAL_COMMUNITY)
Admission: RE | Admit: 2023-04-18 | Discharge: 2023-04-18 | Disposition: A | Discharge status: Home / Self Care | Payer: Medicare Other | Source: Ambulatory Visit | Attending: Internal Medicine | Admitting: Internal Medicine

## 2023-04-18 DIAGNOSIS — I6523 Occlusion and stenosis of bilateral carotid arteries: Secondary | ICD-10-CM | POA: Insufficient documentation

## 2023-04-21 NOTE — Progress Notes (Signed)
I have personally reviewed the images. There is heterogeneous plaque at the bulb, but ICA appears near normal and no stenosis noted. F/U studies PRN.  I will discuss on visit soon. MyChart message sent

## 2023-05-31 ENCOUNTER — Encounter: Payer: Self-pay | Admitting: Cardiology

## 2023-05-31 ENCOUNTER — Ambulatory Visit: Payer: Medicare Other | Attending: Cardiology | Admitting: Cardiology

## 2023-05-31 VITALS — BP 110/62 | HR 85 | Resp 16 | Ht 62.0 in | Wt 185.0 lb

## 2023-05-31 DIAGNOSIS — E7800 Pure hypercholesterolemia, unspecified: Secondary | ICD-10-CM | POA: Insufficient documentation

## 2023-05-31 DIAGNOSIS — I10 Essential (primary) hypertension: Secondary | ICD-10-CM | POA: Insufficient documentation

## 2023-05-31 MED ORDER — VALSARTAN-HYDROCHLOROTHIAZIDE 160-12.5 MG PO TABS: 1.0000 | ORAL_TABLET | Freq: Every morning | ORAL | 3 refills | Status: AC

## 2023-05-31 NOTE — Patient Instructions (Addendum)
 Medication Instructions:  Your physician recommends that you continue on your current medications as directed. Please refer to the Current Medication list given to you today.  *If you need a refill on your cardiac medications before your next appointment, please call your pharmacy*   Lab Work: none If you have labs (blood work) drawn today and your tests are completely normal, you will receive your results only by: MyChart Message (if you have MyChart) OR A paper copy in the mail If you have any lab test that is abnormal or we need to change your treatment, we will call you to review the results.   Testing/Procedures: none   Follow-Up: At [facility], you and your health needs are our priority.  As part of our continuing mission to provide you with exceptional heart care, we have created designated Provider Care Teams.  These Care Teams include your primary Cardiologist (physician) and Advanced Practice Providers (APPs -  Physician Assistants and Nurse Practitioners) who all work together to provide you with the care you need, when you need it.  We recommend signing up for the patient portal called "MyChart".  Sign up information is provided on this After Visit Summary.  MyChart is used to connect with patients for Virtual Visits (Telemedicine).  Patients are able to view lab/test results, encounter notes, upcoming appointments, etc.  Non-urgent messages can be sent to your provider as well.   To learn more about what you can do with MyChart, go to [email or web address].    Your next appointment:   As needed  Provider:   Yates Decamp, MD     Other Instructions

## 2023-05-31 NOTE — Progress Notes (Signed)
Cardiology Office Note:  .   Date:  05/31/2023  ID:  Amanda Dean, DOB [birth year], MRN [id number] PCP: Loyola Mast, NP  [facility] Providers Cardiologist:  Yates Decamp, MD   History of Present Illness: .   Amanda Dean is a 73 y.o. with history of stroke, S/P ASD repair with 28-mm Starflex septal occluder closure of the ASD 05/05/2008, history of tobacco use disorder with COPD and quit smoking in 2015, hypertension, asymptomatic carotid artery stenosis and hyperlipidemia.   Discussed the use of AI scribe software for clinical note transcription with the patient, who gave verbal consent to proceed.  History of Present Illness   The patient presents for a cardiology follow-up visit. No dyspnea, chest pain or palpitations.       Labs  External Labs:  PCP labs:  Labs 11/09/2022:   Sodium 140, potassium 4.5, BUN 19, creatinine 0.76, EGFR 83 mL, LFTs normal.   A1c 6.7%.  TSH normal at 2.131.   Total cholesterol 145, triglycerides 84, HDL 63, LDL 66.  Non-HDL cholesterol 82.   Hb 12.3/HCT 35.7, platelets 232, normal indicis.  Review of Systems  Cardiovascular:  Negative for chest pain, dyspnea on exertion and leg swelling.    Physical Exam:   VS:  BP 110/62 (BP Location: Left Arm, Patient Position: Sitting, Cuff Size: Normal)   Pulse 85   Resp 16   Ht 5\' 2"  (1.575 m)   Wt 185 lb (83.9 kg)   SpO2 95%   BMI 33.84 kg/m    Wt Readings from Last 3 Encounters:  05/31/23 185 lb (83.9 kg)  12/21/22 187 lb (84.8 kg)  04/26/22 189 lb (85.7 kg)     Physical Exam Neck:     Vascular: No JVD.  Cardiovascular:     Rate and Rhythm: Normal rate and regular rhythm.     Pulses: Intact distal pulses.     Heart sounds: S1 normal and S2 normal. Murmur heard.     Early systolic murmur is present with a grade of 2/6 at the upper right sternal border.     No gallop.  Pulmonary:     Effort: Pulmonary effort is normal.     Breath sounds: Normal breath sounds.  Abdominal:      General: Bowel sounds are normal.     Palpations: Abdomen is soft.  Musculoskeletal:     Right lower leg: No edema.     Left lower leg: No edema.    Studies Reviewed: .    Echocardiogram 10/20/2020: Normal LV systolic function with EF 55%. Left ventricle cavity is normal in size. Moderate concentric hypertrophy of the left ventricle. Normal global wall motion. Doppler evidence of grade II (pseudonormal) diastolic dysfunction, elevated LAP. Calculated EF 55%. Left atrial cavity is moderately dilated by volume. Structurally normal mitral valve.  Mild (Grade I) mitral regurgitation. Structurally normal tricuspid valve.  Mild tricuspid regurgitation. No evidence of pulmonary hypertension. Compared to 04/03/2019, grade I diastolic dysfunction is now grade II.  Exercise Tetrofosmin stress test 06/21/2021: Exercise nuclear stress test was performed using Bruce protocol. Patient reached 5.9 METS, and 91% of age predicted maximum heart rate. Exercise capacity was low. No chest pain reported. Normal heart rate response. Resting hypertension 140/80 mmHg, with hypertensive exercise response. peak BP 209/60 mmHg. Stress EKG revealed no ischemic changes. Normal myocardial perfusion. Stress LVEF 77%. Low risk study.  Recommend hypertension management.  Carotid artery duplex 04/18/2023: Right Carotid: The extracranial vessels were near-normal with only minimal  wall  thickening or plaque.  Left Carotid: The extracranial vessels were near-normal with only minimal  wall  thickening or plaque.  Vertebrals:  Bilateral vertebral arteries demonstrate antegrade flow.  Subclavians: Normal flow hemodynamics were seen in bilateral subclavian arteries.  EKG:    EKG 12/21/2022: Normal sinus rhythm at rate of 57 bpm, left atrial enlargement, normal axis. LVH. Incomplete right bundle branch block. Cannot exclude septal infarct old.   Medications and allergies    Allergies  Allergen Reactions   Spironolactone  Other (See Comments)    Hair loss   Crestor [Rosuvastatin] Other (See Comments)    Memory loss      Current Outpatient Medications:    amLODipine (NORVASC) 5 MG tablet, Take 1 tablet (5 mg total) by mouth daily., Disp: 90 tablet, Rfl: 3   aspirin 81 MG tablet, Take 81 mg by mouth daily., Disp: , Rfl:    Calcium-Vitamin D-Vitamin K [id number] MG-UNT-MCG TABS, Take 1 tablet by mouth in the morning and at bedtime., Disp: , Rfl:    cetirizine (ZYRTEC) 10 MG tablet, Take 1 tablet by mouth daily., Disp: , Rfl:    metFORMIN (GLUCOPHAGE-XR) 500 MG 24 hr tablet, Take 1 tablet by mouth daily with breakfast., Disp: , Rfl:    pravastatin (PRAVACHOL) 40 MG tablet, TAKE 1 TABLET EVERY EVENING, Disp: 90 tablet, Rfl: 3   solifenacin (VESICARE) 5 MG tablet, Take 2 tablets by mouth daily., Disp: , Rfl:    Biotin 5000 MCG CAPS, Take 1 capsule by mouth daily., Disp: , Rfl:    ezetimibe (ZETIA) 10 MG tablet, Take 1 tablet (10 mg total) by mouth daily., Disp: 90 tablet, Rfl: 3   fluticasone (FLONASE) 50 MCG/ACT nasal spray, Place 1-2 sprays into the nose as needed. (Patient not taking: Reported on 05/31/2023), Disp: , Rfl:    guaiFENesin (MUCINEX) 600 MG 12 hr tablet, Take 1 tablet by mouth daily. (Patient not taking: Reported on 05/31/2023), Disp: , Rfl:    valsartan-hydrochlorothiazide (DIOVAN-HCT) 160-12.5 MG tablet, Take 1 tablet by mouth every morning., Disp: 90 tablet, Rfl: 3   ASSESSMENT AND PLAN: .      ICD-10-CM   1. Essential hypertension  I10 valsartan-hydrochlorothiazide (DIOVAN-HCT) 160-12.5 MG tablet    2. Hypercholesteremia  E78.00      Assessment and Plan    Carotid Artery Disease Carotid duplex shows significant improvement with minimal wall thickening. Previous moderate disease is resolved. Reports no longer hearing a bruit, and the physical exam is normal. - Discontinue annual carotid duplex - Continue azeromide - Continue pravastatin - Continue daily baby  aspirin  Hypertension Blood pressure is well-controlled with valsartan HCT. - Continue valsartan HCT  Diastolic Dysfunction Mild diastolic dysfunction noted on 1610 echocardiogram. Asymptomatic with clear lungs and normal heart exam. No significant valve issues. - No restrictions on physical activity  General Health Maintenance In good health with normal recent EKG and stress test results. - Continue current medications - Follow up as needed.  Can transfer her Rx to her PCP going forwards.       Signed,  Yates Decamp, MD, [facility] 05/31/2023, 4:58 PM [facility] [address] [place], [place] [address] Phone: [phone]. Fax:  [phone]

## 2024-01-02 ENCOUNTER — Other Ambulatory Visit: Payer: Self-pay | Admitting: Cardiology

## 2024-01-02 DIAGNOSIS — I6523 Occlusion and stenosis of bilateral carotid arteries: Secondary | ICD-10-CM

## 2024-01-02 DIAGNOSIS — E7800 Pure hypercholesterolemia, unspecified: Secondary | ICD-10-CM

## 2024-01-27 ENCOUNTER — Other Ambulatory Visit: Payer: Self-pay | Admitting: Cardiology

## 2024-01-27 DIAGNOSIS — I6523 Occlusion and stenosis of bilateral carotid arteries: Secondary | ICD-10-CM

## 2024-01-27 DIAGNOSIS — E7800 Pure hypercholesterolemia, unspecified: Secondary | ICD-10-CM

## 2024-01-28 ENCOUNTER — Other Ambulatory Visit: Payer: Self-pay | Admitting: Cardiology

## 2024-01-28 DIAGNOSIS — I6523 Occlusion and stenosis of bilateral carotid arteries: Secondary | ICD-10-CM

## 2024-01-28 DIAGNOSIS — E7800 Pure hypercholesterolemia, unspecified: Secondary | ICD-10-CM

## 2024-01-30 ENCOUNTER — Encounter: Payer: Self-pay | Admitting: Cardiology

## 2024-04-10 ENCOUNTER — Ambulatory Visit: Admitting: Cardiology

## 2024-04-10 ENCOUNTER — Encounter: Payer: Self-pay | Admitting: Cardiology

## 2024-04-10 VITALS — BP 130/57 | HR 67 | Resp 16 | Ht 62.0 in | Wt 186.0 lb

## 2024-04-10 DIAGNOSIS — E7800 Pure hypercholesterolemia, unspecified: Secondary | ICD-10-CM | POA: Diagnosis present

## 2024-04-10 DIAGNOSIS — I2510 Atherosclerotic heart disease of native coronary artery without angina pectoris: Secondary | ICD-10-CM

## 2024-04-10 DIAGNOSIS — R0609 Other forms of dyspnea: Secondary | ICD-10-CM | POA: Diagnosis present

## 2024-04-10 DIAGNOSIS — I10 Essential (primary) hypertension: Secondary | ICD-10-CM

## 2024-04-10 NOTE — Patient Instructions (Addendum)
 Medication Instructions:  Your physician recommends that you continue on your current medications as directed. Please refer to the Current Medication list given to you today.  *If you need a refill on your cardiac medications before your next appointment, please call your pharmacy*  Lab Work: None ordered  If you have labs (blood work) drawn today and your tests are completely normal, you will receive your results only by: MyChart Message (if you have MyChart) OR A paper copy in the mail If you have any lab test that is abnormal or we need to change your treatment, we will call you to review the results.  Testing/Procedures: Your physician has requested that you have an echocardiogram. Echocardiography is a painless test that uses sound waves to create images of your heart. It provides your doctor with information about the size and shape of your heart and how well your hearts chambers and valves are working. This procedure takes approximately one hour. There are no restrictions for this procedure. Please do NOT wear cologne, perfume, aftershave, or lotions (deodorant is allowed). Please arrive 15 minutes prior to your appointment time.  Please note: We ask at that you not bring children with you during ultrasound (echo/ vascular) testing. Due to room size and safety concerns, children are not allowed in the ultrasound rooms during exams. Our front office staff cannot provide observation of children in our lobby area while testing is being conducted. An adult accompanying a patient to their appointment will only be allowed in the ultrasound room at the discretion of the ultrasound technician under special circumstances. We apologize for any inconvenience.   You have been referred to [facility] PULMONOLOGY, DR. THEOPHILUS OR DR. GERONIMO  Follow-Up: At [facility], you and your health needs are our priority.  As part of our continuing mission to provide you with exceptional heart care,  our providers are all part of one team.  This team includes your primary Cardiologist (physician) and Advanced Practice Providers or APPs (Physician Assistants and Nurse Practitioners) who all work together to provide you with the care you need, when you need it.  Your next appointment:   3 month(s)  Provider:   Gordy Bergamo, MD    We recommend signing up for the patient portal called MyChart.  Sign up information is provided on this After Visit Summary.  MyChart is used to connect with patients for Virtual Visits (Telemedicine).  Patients are able to view lab/test results, encounter notes, upcoming appointments, etc.  Non-urgent messages can be sent to your provider as well.   To learn more about what you can do with MyChart, go to [email or web address].   Other Instructions       We recommend signing up for the patient portal called MyChart.  Patients are able to view lab/test results, encounter notes, upcoming appointments, etc.  Non-urgent messages can be sent to your provider as well, go to [email or web address].

## 2024-04-10 NOTE — Progress Notes (Signed)
 Cardiology Office Note:  .   Date:  04/10/2024  ID:  Amanda Dean, DOB 1951/04/21, MRN [id number] PCP: Rosalva Doffing, NP  [facility] Providers Cardiologist:  Gordy Bergamo, MD   History of Present Illness: .   Amanda Dean is a 73 y.o.  with history of stroke, S/P ASD repair with 28-mm Starflex septal occluder closure of the ASD 05/05/2008, history of tobacco use disorder with COPD and quit smoking in 2015, hypertension, asymptomatic moderate carotid artery stenosis which is resolved with aggressive treatment of lipids and hyperlipidemia.    On her last office visit in January 2025, I had made her visits as needed however she was referred back to me by her PCP as she has been having worsening symptoms of dyspnea even doing minimal activities.  No PND or orthopnea.  No chest pain or palpitations.    Discussed the use of AI scribe software for clinical note transcription with the patient, who gave verbal consent to proceed.  History of Present Illness Amanda Dean is a 73 year old female with coronary artery disease who presents with worsening shortness of breath. She was referred by her primary care physician for evaluation of her shortness of breath.  She has had shortness of breath for 2 to 3 years with progressive worsening. It is now severe enough to limit routine activities such as climbing stairs, where she must stop to rest, though she does not feel suffocated.  She has coronary artery disease with plaque noted on prior coronary CT. She takes pravastatin  40 mg daily and ezetimibe  10 mg with good cholesterol control. She has diet-controlled diabetes with a recent A1c of 6.2%. Her blood pressure is well controlled on amlodipine  5 mg daily and valsartan  HCT 160/12.5 mg without reported side effects.  She reports prior lung problems in childhood that may have caused scarring but no known COPD or restrictive lung disease.  She has a remote smoking history and has been trying to lose weight  for many years.  Cardiac Studies relevent.    Carotid artery duplex 04/18/2023 Bilateral minimal intimal wall thickening without stenosis. Bilateral antegrade vertebral flow and normal hemodynamics in the subclavian's.  Exercise Tetrofosmin  stress test 06/21/2021:  Normal myocardial perfusion scan, LVEF 77%.  Echocardiogram 10/20/2020: Normal LV systolic function with EF 55%.  Grade 2 diastolic dysfunction. Mild MR and TR.  Labs   Care everywhere/Faxed External Labs:  Labs 03/27/2024:  Hgb A1c 6.2, TSH normal at 3.229  BUN 18, creatinine 0.80, eGFR 77 mL, potassium 4.1.  Hb 12.8/HCT 37.9, platelets 221.  Lipid profile 12/17/2023: Total cholesterol 156, triglycerides 83, HDL 68, LDL 71.  ROS  Review of Systems  Cardiovascular:  Positive for dyspnea on exertion. Negative for chest pain and leg swelling.   Physical Exam:   VS:  BP (!) 130/57 (BP Location: Left Arm, Patient Position: Sitting, Cuff Size: Small)   Pulse 67   Resp 16   Ht 5' 2 (1.575 m)   Wt 186 lb (84.4 kg)   SpO2 94%   BMI 34.02 kg/m    Wt Readings from Last 3 Encounters:  04/10/24 186 lb (84.4 kg)  05/31/23 185 lb (83.9 kg)  12/21/22 187 lb (84.8 kg)    BP Readings from Last 3 Encounters:  04/10/24 (!) 130/57  05/31/23 110/62  12/21/22 115/76   Physical Exam Neck:     Vascular: No carotid bruit or JVD.  Cardiovascular:     Rate and Rhythm: Normal rate and regular rhythm.  Pulses: Intact distal pulses.     Heart sounds: S1 normal and S2 normal. Murmur heard.     Early systolic murmur is present with a grade of 2/6 at the upper right sternal border.     No gallop.  Pulmonary:     Effort: Pulmonary effort is normal.     Breath sounds: Rhonchi (diffuse soft distant ronchi) present.  Abdominal:     General: Bowel sounds are normal.     Palpations: Abdomen is soft.  Musculoskeletal:     Right lower leg: No edema.     Left lower leg: No edema.    EKG:    EKG  Interpretation Date/Time:  Friday April 10 2024 13:38:04 EST Ventricular Rate:  64 PR Interval:  150 QRS Duration:  96 QT Interval:  432 QTC Calculation: 445 R Axis:   -16  Text Interpretation: EKG 04/10/2024: Normal sinus rhythm at rate of 64 bpm, LVH.  Nonspecific ST-T abnormality.  No significant change from prior EKG. Confirmed by Melora Menon, Jagadeesh ([id number]) on 04/10/2024 1:50:59 PM    ASSESSMENT AND PLAN: .      ICD-10-CM   1. Dyspnea on exertion  R06.09 Ambulatory referral to Pulmonology    ECHOCARDIOGRAM COMPLETE    2. Coronary artery calcification  I25.10 Ambulatory referral to Pulmonology    ECHOCARDIOGRAM COMPLETE    3. Essential hypertension  I10 EKG 12-Lead    Ambulatory referral to Pulmonology    ECHOCARDIOGRAM COMPLETE    4. Hypercholesteremia  E78.00 Ambulatory referral to Pulmonology    ECHOCARDIOGRAM COMPLETE     Assessment & Plan Dyspnea on exertion Chronic dyspnea on exertion for 2-3 years, now severe enough to require rest during routine activities. Differential diagnosis includes cardiac causes such as diastolic heart failure or pulmonary hypertension, and pulmonary causes such as scarring from past bronchitis or pneumonia. Previous CT scans show stable lung nodules with no malignancy. Stress test from 2023 was negative, but major blockages cannot be ruled out. Current suspicion leans more towards pulmonary issues than cardiac. - Ordered echocardiogram to assess for heart failure or pulmonary hypertension. - Referred to pulmonologist for evaluation of dyspnea and potential lung issues, especially to exclude IPF as I hear diffuse faint crackles bilaterally. - Will consider heart catheterization if pulmonary evaluation is inconclusive.  Atherosclerotic heart disease with coronary artery calcification Coronary artery calcification noted on previous CT scans. Cholesterol levels are well controlled with current medication regimen. No recent changes in EKG or  physical exam suggestive of acute cardiac issues. Current suspicion of significant blockage is low, but not excluded. - Continue current cholesterol management with pravastatin  and ezetimibe .  Essential hypertension Blood pressure is well controlled on current medication regimen. - Continue current antihypertensive regimen with amlodipine  and valsartan  HCT.  Hypercholesterolemia Cholesterol levels are well controlled with current medication regimen. LDL is 71, HDL is 68, and cholesterol ratio is favorable. - Continue current cholesterol management with pravastatin  and ezetimibe .  Type 2 diabetes mellitus, diet controlled Diabetes is diet controlled with an A1c of 6.2%. - Continue current dietary management for diabetes.  History of carotid artery disease, resolved Previous carotid artery disease resolved with cholesterol management. No current bruit detected on examination. - Continue current cholesterol management to prevent recurrence.  Anxiety disorder, controlled Anxiety is well controlled, and she appears calmer during the visit. - Continue current management for anxiety.   Follow up: 3 Months Dyspnea, F/U Pulmonary referral and echo   Signed,  Gordy Bergamo, MD, [facility] 04/10/2024, 5:50 PM  [facility] [address] [place], [place] [address] Phone: [phone]. Fax:  [phone]

## 2024-05-14 ENCOUNTER — Encounter: Payer: Self-pay | Admitting: Internal Medicine

## 2024-05-14 ENCOUNTER — Ambulatory Visit: Admitting: Internal Medicine

## 2024-05-14 VITALS — BP 118/62 | HR 74 | Temp 97.8°F | Ht 63.0 in | Wt 189.8 lb

## 2024-05-14 DIAGNOSIS — R918 Other nonspecific abnormal finding of lung field: Secondary | ICD-10-CM | POA: Diagnosis not present

## 2024-05-14 DIAGNOSIS — R0609 Other forms of dyspnea: Secondary | ICD-10-CM | POA: Diagnosis not present

## 2024-05-14 DIAGNOSIS — Z8679 Personal history of other diseases of the circulatory system: Secondary | ICD-10-CM | POA: Diagnosis not present

## 2024-05-14 DIAGNOSIS — Z87891 Personal history of nicotine dependence: Secondary | ICD-10-CM | POA: Diagnosis not present

## 2024-05-14 NOTE — Progress Notes (Signed)
 "      OV 05/14/2024  Subjective:  Patient ID: Amanda Dean, female , DOB: 1950-06-18 , age 74 y.o. , MRN: [id number] , ADDRESS: [address] [place] [address] PCP Rosalva Doffing, NP Patient Care Team: Rosalva Doffing, NP as PCP - General (Family Medicine) Ladona Heinz, MD as PCP - Cardiology (Cardiology)  This Provider for this visit: Treatment Team:  Attending Provider: Geronimo Amel, MD    05/14/2024 -   Chief Complaint  Patient presents with   Consult    Pt states cardio doctor referred her for SOB  SOB occurs w/ any activity      HPI Amanda Dean 74 y.o. -Amanda Dean is a 74 year old female with a history of stroke and PFO closure who presents with shortness of breath after exertion. She was referred by Dr. Ladona, her cardiologist, for evaluation of her shortness of breath. ALso because he thought he heard crackles. She has been experiencing shortness of breath after exertion for the past six to nine months. TOld Dr [name] x 2-3  years. Activities such as climbing fifteen steps cause her to become 'heavy breathing', but her symptoms improve with rest. No significant chest pain, wheezing, or persistent cough, although she occasionally coughs due to sinusitis-related drainage.  She has a history of a stroke in 2001, attributed to a blood clot passing through a patent foramen ovale (PFO). In 2009, she underwent a PFO closure. She had a stress test and echocardiogram in 2022. This showed GRADE 2 DIASTOLI DYSFUNCITOIN  She mentions a history of bursitis in her hip about two years ago, which affected her ability to walk daily. She used to walk at least one mile every day before the bursitis and believes that walking helped her lung function.  She quit smoking in 2015. She takes Zyrtec daily for sinusitis and occasionally uses Mucinex. No seasonal allergies or a runny nose. She lives alone and is unsure if she snores at night, but she does not feel excessively sleepy during the  day.        SIT STAND TEST - goal 15 times   05/14/2024    O2 used ra   PRobe - finter or forehead finger   Number sit and stand completed - goal 15 15   Time taken to complete 39.2   Resting Pulse Ox/HR/Dyspnea  94% and 70/min and dyspnea of 0/10    Peak measures 92 % and 105/min and dyspnea of 3/10   Final Pulse Ox/HR 93% and 76/min and dyspnea of 1/10   Desaturated </= 88% no   Desaturated <= 3% points no   Got Tachycardic >/= 90/min yes   Miscellaneous comments no       CT Chest data from date: FEB 2025 LDCT at [facility]  - personally visualized and independently interpreted : NO - my findings are:  FINDINGS:   LUNG NODULES:   Similar juxtapleural right lower lobe solid nodule measuring 5 x 7 mm (series 3, image 114).  Automated ROIs generated by ClearRead CT on 2023-06-20.   Reference lung nodules are detailed below from series 3, using 2022-05-24, series 3 for comparison (average diameter):   - 4.6 mm, RUL, GroundGlass, volume: 30 / 17 mm^3 (image 45). Previously 2.7 mm [+70 %] (image 38).  - 3.6 mm, LUL, GroundGlass, volume: 25 mm^3 (image 46). No match found in prior study.  - 7.0 mm, RLL, Solid, volume: 174 / 163 mm^3 (image 114). Previously 7.1 mm [-1 %] (  image 111).  - 2.8 mm, LLL, Solid, volume: 13 mm^3 (image 124). No match found in prior study.   There may be additional small nodules present, but they do not change the diagnostic recommendations of this report.    LUNGS/AIRWAY: No focal consolidation or pneumothorax. Similar nodularity along the anterior trachea.   PLEURA: No pleural thickening or effusion.   HEART: Heart size normal. No pericardial effusion. ASD closure device.   CORONARY ARTERY CALCIFICATION: Moderate.     ECHO July 2014: NORMAL   PFT      No data to display             LAB RESULTS last 96 hours No results found.       has a past medical history of ASD (atrial septal defect), COPD (chronic obstructive  pulmonary disease) (HCC), CVA (cerebral infarction), Hyperlipidemia, Hypoxemia, LVH (left ventricular hypertrophy), and Tobacco abuse.   reports that she quit smoking about 10 years ago. Her smoking use included cigarettes. She started smoking about 45 years ago. She has a 35 pack-year smoking history. She has never used smokeless tobacco.  Past Surgical History:  Procedure Laterality Date   ASD REPAIR  05/05/2008   Dr. Chevis 28mm device   CARDIAC CATHETERIZATION  12/11/2007   10-20% LUMINAL IRREGULARITY MID LAD, otherwise normal coronary arteries   Cerebral angioplasty     NM MYOVIEW  LTD  01/12/2011   no ischemia   US  ECHOCARDIOGRAPHY  10/25/2011   mod. concentric  LVH,EF 60-65%,trace MR    Allergies[1]  Immunization History  Administered Date(s) Administered   Influenza Whole 01/31/2009   PFIZER(Purple Top)SARS-COV-2 Vaccination 05/19/2019, 06/09/2019, 03/11/2020   Pfizer Covid-19 Vaccine Bivalent Booster 17yrs & up 02/16/2021    Family History  Problem Relation Age of Onset   Rheumatic fever Mother    Prostate cancer Father     Current Medications[2]      Objective:   Vitals:   05/14/24 1344  BP: 118/62  Pulse: 74  Temp: 97.8 F (36.6 C)  TempSrc: Oral  SpO2: 93%  Weight: 189 lb 12.8 oz (86.1 kg)  Height: 5' 3 (1.6 m)    Estimated body mass index is 33.62 kg/m as calculated from the following:   Height as of this encounter: 5' 3 (1.6 m).   Weight as of this encounter: 189 lb 12.8 oz (86.1 kg).  @WEIGHTCHANGE @  [employer]   05/14/24 1344  Weight: 189 lb 12.8 oz (86.1 kg)     Physical Exam   General: No distress. Looks well O2 at rest: no Cane present: no Sitting in wheel chair: no Frail: no Obese: YES Neuro: Alert and Oriented x 3. GCS 15. Speech normal Psych: Pleasant Resp:  Barrel Chest - no.  Wheeze - no, Crackles - no, No overt respiratory distress CVS: Normal heart sounds. Murmurs - no Ext: Stigmata of Connective Tissue  Disease - no HEENT: Normal upper airway. PEERL +. No post nasal drip        Assessment/     Assessment & Plan DOE (dyspnea on exertion)  Stopped smoking with greater than 30 pack year history  Multiple lung nodules on CT  History of diastolic dysfunction    PLAN Patient Instructions     ICD-10-CM   1. DOE (dyspnea on exertion)  R06.09     2. Stopped smoking with greater than 30 pack year history  Z87.891     3. Multiple lung nodules on CT  R91.8     4.  History of diastolic dysfunction  Z86.79        Reason for shortness of breath is currently not known  Plan - Get high-resolution CT chest supine and prone - Get full pulmonary function test - Get overnight pulse oximetry on room air - Await echocardiogram ordered by Dr. Ladona  Follow-up - Return to see nurse practitioner in 4-8 weeks but after completing all of the above    FOLLOWUP    Return for - Return to see nurse practitioner in 4-8 weeks but after completing all of the above.    SIGNATURE    Dr. Dorethia Cave, M.D., F.C.C.P,  Pulmonary and Critical Care Medicine Staff Physician, [facility] Director - Interstitial Lung Disease  Program  Pulmonary Fibrosis [facility] [place], [place], [address]  Pager: [phone], If no answer or between  15:00h - 7:00h: call [phone] Telephone: [phone]  2:38 PM 05/14/2024     [1]  Allergies Allergen Reactions   Spironolactone Other (See Comments)    Hair loss   Lexapro [Escitalopram]    Crestor  [Rosuvastatin ] Other (See Comments)    Memory loss   [2]  Current Outpatient Medications:    amLODipine  (NORVASC ) 5 MG tablet, Take 1 tablet (5 mg total) by mouth daily., Disp: 90 tablet, Rfl: 3   Biotin 5000 MCG CAPS, Take 1 capsule by mouth daily., Disp: , Rfl:    Calcium -Vitamin D -Vitamin K [id number] MG-UNT-MCG TABS, Take 1 tablet by mouth in the morning and at bedtime., Disp: , Rfl:     cetirizine (ZYRTEC) 10 MG tablet, Take 1 tablet by mouth daily., Disp: , Rfl:    ezetimibe  (ZETIA ) 10 MG tablet, Take 1 tablet (10 mg total) by mouth daily., Disp: 90 tablet, Rfl: 3   guaiFENesin (MUCINEX) 600 MG 12 hr tablet, Take 1 tablet by mouth daily. (Patient taking differently: Take 1 tablet by mouth daily. As needed), Disp: , Rfl:    metFORMIN (GLUCOPHAGE-XR) 500 MG 24 hr tablet, Take 1 tablet by mouth daily with breakfast., Disp: , Rfl:    pravastatin  (PRAVACHOL ) 40 MG tablet, TAKE 1 TABLET EVERY EVENING, Disp: 90 tablet, Rfl: 1   sertraline (ZOLOFT) 25 MG tablet, Take 25 mg by mouth daily., Disp: , Rfl:    solifenacin (VESICARE) 5 MG tablet, Take 2 tablets by mouth daily., Disp: , Rfl:    valsartan -hydrochlorothiazide  (DIOVAN -HCT) 160-12.5 MG tablet, Take 1 tablet by mouth every morning., Disp: 90 tablet, Rfl: 3  "

## 2024-05-14 NOTE — Patient Instructions (Signed)
"    ICD-10-CM   1. DOE (dyspnea on exertion)  R06.09     2. Stopped smoking with greater than 30 pack year history  Z87.891     3. Multiple lung nodules on CT  R91.8     4. History of diastolic dysfunction  Z86.79        Reason for shortness of breath is currently not known  Plan - Get high-resolution CT chest supine and prone - Get full pulmonary function test - Get overnight pulse oximetry on room air - Await echocardiogram ordered by Dr. Ladona  Follow-up - Return to see nurse practitioner in 4-8 weeks but after completing all of the above "

## 2024-05-15 ENCOUNTER — Telehealth: Payer: Self-pay | Admitting: *Deleted

## 2024-05-15 NOTE — Telephone Encounter (Signed)
 Copied from CRM [id number]. Topic: Appointments - Scheduling Inquiry for Clinic >> May 15, 2024 12:17 PM Amanda Dean wrote: Reason for CRM: Patient is upset that her CT date and time were scheduled for her, she would like a call back to reschedule and is inquiring about an imaging center in [place] rather than [place].  Patient was called and given the phone number for Central scheduling [phone] so she could call and reschedule her CT chest

## 2024-05-20 ENCOUNTER — Ambulatory Visit (HOSPITAL_COMMUNITY)
Admission: RE | Admit: 2024-05-20 | Discharge: 2024-05-20 | Disposition: A | Discharge status: Home / Self Care | Source: Ambulatory Visit | Attending: Cardiology | Admitting: Cardiology

## 2024-05-20 ENCOUNTER — Ambulatory Visit: Payer: Self-pay | Admitting: Cardiology

## 2024-05-20 DIAGNOSIS — R0600 Dyspnea, unspecified: Secondary | ICD-10-CM | POA: Diagnosis not present

## 2024-05-20 DIAGNOSIS — I2510 Atherosclerotic heart disease of native coronary artery without angina pectoris: Secondary | ICD-10-CM | POA: Insufficient documentation

## 2024-05-20 DIAGNOSIS — E7800 Pure hypercholesterolemia, unspecified: Secondary | ICD-10-CM | POA: Insufficient documentation

## 2024-05-20 DIAGNOSIS — R0609 Other forms of dyspnea: Secondary | ICD-10-CM | POA: Diagnosis present

## 2024-05-20 DIAGNOSIS — I10 Essential (primary) hypertension: Secondary | ICD-10-CM | POA: Insufficient documentation

## 2024-05-20 LAB — ECHOCARDIOGRAM COMPLETE
Area-P 1/2: 2.9 cm2
S' Lateral: 3 cm

## 2024-05-20 NOTE — Progress Notes (Signed)
Essentially normal echo

## 2024-05-21 ENCOUNTER — Other Ambulatory Visit

## 2024-05-23 ENCOUNTER — Ambulatory Visit (HOSPITAL_BASED_OUTPATIENT_CLINIC_OR_DEPARTMENT_OTHER)

## 2024-06-01 ENCOUNTER — Other Ambulatory Visit

## 2024-06-02 ENCOUNTER — Encounter: Payer: Self-pay | Admitting: Internal Medicine

## 2024-06-03 ENCOUNTER — Encounter: Payer: Self-pay | Admitting: Internal Medicine

## 2024-06-03 NOTE — Telephone Encounter (Signed)
 Please advise on oximetry test results per patient request

## 2024-06-04 NOTE — Telephone Encounter (Signed)
 ONO shows some nocturnal desaturations which means her oxygen level is dropping at night.  .Dr. Geronimo patient, he is out of the office  Can begin Oxygen 2l/m At bedtime  if she would like to start, I do not have an explanation of why she is droping her oxygen At bedtime  until we get more data from PFT , CT chest , etc

## 2024-06-05 ENCOUNTER — Other Ambulatory Visit

## 2024-06-05 DIAGNOSIS — Z8679 Personal history of other diseases of the circulatory system: Secondary | ICD-10-CM

## 2024-06-05 DIAGNOSIS — R0609 Other forms of dyspnea: Secondary | ICD-10-CM

## 2024-06-05 DIAGNOSIS — Z87891 Personal history of nicotine dependence: Secondary | ICD-10-CM

## 2024-06-05 DIAGNOSIS — R918 Other nonspecific abnormal finding of lung field: Secondary | ICD-10-CM

## 2024-07-10 ENCOUNTER — Encounter

## 2024-07-10 ENCOUNTER — Ambulatory Visit: Admitting: Adult Health

## 2024-07-10 ENCOUNTER — Ambulatory Visit: Admitting: Cardiology
